# Patient Record
Sex: Male | Born: 1965 | Race: White | Hispanic: No | Marital: Single | Smoking: Former smoker
Health system: Southern US, Community
[De-identification: ages and names within clinical notes are randomized; demographics above are authoritative.]

## PROBLEM LIST (undated history)

## (undated) DIAGNOSIS — I509 Heart failure, unspecified: Secondary | ICD-10-CM

## (undated) DIAGNOSIS — F17200 Nicotine dependence, unspecified, uncomplicated: Secondary | ICD-10-CM

## (undated) DIAGNOSIS — I219 Acute myocardial infarction, unspecified: Secondary | ICD-10-CM

## (undated) DIAGNOSIS — E119 Type 2 diabetes mellitus without complications: Secondary | ICD-10-CM

---
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
## 1985-07-05 DIAGNOSIS — F17200 Nicotine dependence, unspecified, uncomplicated: Secondary | ICD-10-CM

## 1985-07-05 HISTORY — DX: Nicotine dependence, unspecified, uncomplicated: F17.200

## 2003-07-06 DIAGNOSIS — I509 Heart failure, unspecified: Secondary | ICD-10-CM

## 2003-07-06 HISTORY — DX: Heart failure, unspecified: I50.9

## 2013-07-05 DIAGNOSIS — I219 Acute myocardial infarction, unspecified: Secondary | ICD-10-CM

## 2013-07-05 HISTORY — DX: Acute myocardial infarction, unspecified: I21.9

## 2016-07-05 DIAGNOSIS — E119 Type 2 diabetes mellitus without complications: Secondary | ICD-10-CM

## 2016-07-05 HISTORY — DX: Type 2 diabetes mellitus without complications: E11.9

## 2019-02-02 DIAGNOSIS — I161 Hypertensive emergency: Secondary | ICD-10-CM

## 2019-02-02 DIAGNOSIS — R7989 Other specified abnormal findings of blood chemistry: Secondary | ICD-10-CM

## 2019-02-02 DIAGNOSIS — I351 Nonrheumatic aortic (valve) insufficiency: Secondary | ICD-10-CM

## 2019-02-02 DIAGNOSIS — R634 Abnormal weight loss: Secondary | ICD-10-CM

## 2019-02-02 DIAGNOSIS — I241 Dressler's syndrome: Secondary | ICD-10-CM

## 2019-02-02 DIAGNOSIS — I509 Heart failure, unspecified: Secondary | ICD-10-CM

## 2019-02-02 DIAGNOSIS — Z720 Tobacco use: Secondary | ICD-10-CM

## 2019-02-02 DIAGNOSIS — I340 Nonrheumatic mitral (valve) insufficiency: Secondary | ICD-10-CM

## 2019-02-02 DIAGNOSIS — Z9114 Patient's other noncompliance with medication regimen: Secondary | ICD-10-CM

## 2019-02-03 DIAGNOSIS — R079 Chest pain, unspecified: Secondary | ICD-10-CM

## 2019-02-03 DIAGNOSIS — J189 Pneumonia, unspecified organism: Secondary | ICD-10-CM

## 2019-02-03 DIAGNOSIS — I5043 Acute on chronic combined systolic (congestive) and diastolic (congestive) heart failure: Secondary | ICD-10-CM

## 2019-02-03 DIAGNOSIS — E785 Hyperlipidemia, unspecified: Secondary | ICD-10-CM

## 2019-02-03 DIAGNOSIS — I472 Ventricular tachycardia: Secondary | ICD-10-CM

## 2019-02-03 DIAGNOSIS — I10 Essential (primary) hypertension: Secondary | ICD-10-CM

## 2019-02-03 DIAGNOSIS — E119 Type 2 diabetes mellitus without complications: Secondary | ICD-10-CM

## 2019-02-04 ENCOUNTER — Encounter (HOSPITAL_COMMUNITY): Payer: Self-pay

## 2019-02-04 ENCOUNTER — Inpatient Hospital Stay (HOSPITAL_COMMUNITY)
Admission: AD | Admit: 2019-02-04 | Discharge: 2019-02-08 | DRG: 177 | Disposition: A | Discharge status: Home / Self Care | Payer: Medicaid Other | Source: Other Acute Inpatient Hospital | Attending: Internal Medicine | Admitting: Internal Medicine

## 2019-02-04 ENCOUNTER — Other Ambulatory Visit: Payer: Self-pay

## 2019-02-04 DIAGNOSIS — J189 Pneumonia, unspecified organism: Secondary | ICD-10-CM

## 2019-02-04 DIAGNOSIS — E1165 Type 2 diabetes mellitus with hyperglycemia: Secondary | ICD-10-CM | POA: Diagnosis present

## 2019-02-04 DIAGNOSIS — F1390 Sedative, hypnotic, or anxiolytic use, unspecified, uncomplicated: Secondary | ICD-10-CM | POA: Diagnosis present

## 2019-02-04 DIAGNOSIS — I2510 Atherosclerotic heart disease of native coronary artery without angina pectoris: Secondary | ICD-10-CM | POA: Diagnosis present

## 2019-02-04 DIAGNOSIS — R6881 Early satiety: Secondary | ICD-10-CM | POA: Diagnosis present

## 2019-02-04 DIAGNOSIS — Z681 Body mass index (BMI) 19 or less, adult: Secondary | ICD-10-CM | POA: Diagnosis not present

## 2019-02-04 DIAGNOSIS — I252 Old myocardial infarction: Secondary | ICD-10-CM | POA: Diagnosis not present

## 2019-02-04 DIAGNOSIS — E43 Unspecified severe protein-calorie malnutrition: Secondary | ICD-10-CM | POA: Diagnosis present

## 2019-02-04 DIAGNOSIS — R0602 Shortness of breath: Secondary | ICD-10-CM

## 2019-02-04 DIAGNOSIS — Z794 Long term (current) use of insulin: Secondary | ICD-10-CM | POA: Diagnosis not present

## 2019-02-04 DIAGNOSIS — Z9119 Patient's noncompliance with other medical treatment and regimen: Secondary | ICD-10-CM

## 2019-02-04 DIAGNOSIS — Z20828 Contact with and (suspected) exposure to other viral communicable diseases: Secondary | ICD-10-CM | POA: Diagnosis present

## 2019-02-04 DIAGNOSIS — Z9114 Patient's other noncompliance with medication regimen: Secondary | ICD-10-CM

## 2019-02-04 DIAGNOSIS — F17210 Nicotine dependence, cigarettes, uncomplicated: Secondary | ICD-10-CM | POA: Diagnosis present

## 2019-02-04 DIAGNOSIS — I255 Ischemic cardiomyopathy: Secondary | ICD-10-CM | POA: Diagnosis present

## 2019-02-04 DIAGNOSIS — Z79899 Other long term (current) drug therapy: Secondary | ICD-10-CM | POA: Diagnosis not present

## 2019-02-04 DIAGNOSIS — E785 Hyperlipidemia, unspecified: Secondary | ICD-10-CM | POA: Diagnosis present

## 2019-02-04 DIAGNOSIS — J869 Pyothorax without fistula: Principal | ICD-10-CM | POA: Diagnosis present

## 2019-02-04 DIAGNOSIS — R918 Other nonspecific abnormal finding of lung field: Secondary | ICD-10-CM | POA: Diagnosis present

## 2019-02-04 DIAGNOSIS — Z9689 Presence of other specified functional implants: Secondary | ICD-10-CM

## 2019-02-04 DIAGNOSIS — R9439 Abnormal result of other cardiovascular function study: Secondary | ICD-10-CM

## 2019-02-04 DIAGNOSIS — I5023 Acute on chronic systolic (congestive) heart failure: Secondary | ICD-10-CM | POA: Diagnosis present

## 2019-02-04 DIAGNOSIS — J918 Pleural effusion in other conditions classified elsewhere: Secondary | ICD-10-CM | POA: Diagnosis present

## 2019-02-04 DIAGNOSIS — I712 Thoracic aortic aneurysm, without rupture: Secondary | ICD-10-CM | POA: Diagnosis present

## 2019-02-04 DIAGNOSIS — I110 Hypertensive heart disease with heart failure: Secondary | ICD-10-CM | POA: Diagnosis present

## 2019-02-04 DIAGNOSIS — R64 Cachexia: Secondary | ICD-10-CM | POA: Diagnosis present

## 2019-02-04 DIAGNOSIS — J90 Pleural effusion, not elsewhere classified: Secondary | ICD-10-CM | POA: Diagnosis present

## 2019-02-04 LAB — CBC WITH DIFFERENTIAL/PLATELET
Abs Immature Granulocytes: 0.1 10*3/uL — ABNORMAL HIGH (ref 0.00–0.07)
Basophils Absolute: 0.1 10*3/uL (ref 0.0–0.1)
Basophils Relative: 0 %
Eosinophils Absolute: 0.1 10*3/uL (ref 0.0–0.5)
Eosinophils Relative: 1 %
HCT: 41.3 % (ref 39.0–52.0)
Hemoglobin: 13.8 g/dL (ref 13.0–17.0)
Immature Granulocytes: 1 %
Lymphocytes Relative: 6 %
Lymphs Abs: 1.2 10*3/uL (ref 0.7–4.0)
MCH: 31.4 pg (ref 26.0–34.0)
MCHC: 33.4 g/dL (ref 30.0–36.0)
MCV: 94.1 fL (ref 80.0–100.0)
Monocytes Absolute: 1.7 10*3/uL — ABNORMAL HIGH (ref 0.1–1.0)
Monocytes Relative: 9 %
Neutro Abs: 15.1 10*3/uL — ABNORMAL HIGH (ref 1.7–7.7)
Neutrophils Relative %: 83 %
Platelets: 394 10*3/uL (ref 150–400)
RBC: 4.39 MIL/uL (ref 4.22–5.81)
RDW: 13.6 % (ref 11.5–15.5)
WBC: 18.2 10*3/uL — ABNORMAL HIGH (ref 4.0–10.5)
nRBC: 0 % (ref 0.0–0.2)

## 2019-02-04 LAB — MAGNESIUM: Magnesium: 2.2 mg/dL (ref 1.7–2.4)

## 2019-02-04 LAB — COMPREHENSIVE METABOLIC PANEL
ALT: 22 U/L (ref 0–44)
AST: 19 U/L (ref 15–41)
Albumin: 2 g/dL — ABNORMAL LOW (ref 3.5–5.0)
Alkaline Phosphatase: 66 U/L (ref 38–126)
Anion gap: 14 (ref 5–15)
BUN: 31 mg/dL — ABNORMAL HIGH (ref 6–20)
CO2: 25 mmol/L (ref 22–32)
Calcium: 8.3 mg/dL — ABNORMAL LOW (ref 8.9–10.3)
Chloride: 92 mmol/L — ABNORMAL LOW (ref 98–111)
Creatinine, Ser: 0.9 mg/dL (ref 0.61–1.24)
GFR calc Af Amer: >60 mL/min (ref >60)
GFR calc non Af Amer: >60 mL/min (ref >60)
Glucose, Bld: 227 mg/dL — ABNORMAL HIGH (ref 70–99)
Potassium: 3.8 mmol/L (ref 3.5–5.1)
Sodium: 131 mmol/L — ABNORMAL LOW (ref 135–145)
Total Bilirubin: 0.3 mg/dL (ref 0.3–1.2)
Total Protein: 5.8 g/dL — ABNORMAL LOW (ref 6.5–8.1)

## 2019-02-04 LAB — GLUCOSE, CAPILLARY
Glucose-Capillary: 276 mg/dL — ABNORMAL HIGH (ref 70–99)
Glucose-Capillary: 270 mg/dL — ABNORMAL HIGH (ref 70–99)

## 2019-02-04 LAB — TSH: TSH: 1.91 u[IU]/mL (ref 0.350–4.500)

## 2019-02-04 LAB — BRAIN NATRIURETIC PEPTIDE: B Natriuretic Peptide: 1550.4 pg/mL — ABNORMAL HIGH (ref 0.0–100.0)

## 2019-02-04 LAB — HEMOGLOBIN A1C
Hgb A1c MFr Bld: 8.7 % — ABNORMAL HIGH (ref 4.8–5.6)
Mean Plasma Glucose: 202.99 mg/dL

## 2019-02-04 LAB — PROTIME-INR
INR: 1.2 (ref 0.8–1.2)
Prothrombin Time: 14.7 seconds (ref 11.4–15.2)

## 2019-02-04 LAB — MRSA PCR SCREENING: MRSA by PCR: NEGATIVE

## 2019-02-04 LAB — PHOSPHORUS: Phosphorus: 3.7 mg/dL (ref 2.5–4.6)

## 2019-02-04 MED ORDER — GLIPIZIDE 5 MG PO TABS
5.0000 mg | ORAL_TABLET | Freq: Two times a day (BID) | ORAL | Status: DC
  Filled 2019-02-04: qty 1

## 2019-02-04 MED ORDER — SODIUM CHLORIDE 0.9 % IV SOLN
2.0000 g | Freq: Once | INTRAVENOUS | Status: AC
  Administered 2019-02-04: 22:00:00 2 g via INTRAVENOUS
  Filled 2019-02-04: qty 2

## 2019-02-04 MED ORDER — HEPARIN SODIUM (PORCINE) 5000 UNIT/ML IJ SOLN
5000.0000 [IU] | Freq: Three times a day (TID) | INTRAMUSCULAR | Status: DC
  Administered 2019-02-04 – 2019-02-08 (×11): 5000 [IU] via SUBCUTANEOUS
  Filled 2019-02-04 (×11): qty 1

## 2019-02-04 MED ORDER — INSULIN ASPART 100 UNIT/ML ~~LOC~~ SOLN
0.0000 [IU] | Freq: Three times a day (TID) | SUBCUTANEOUS | Status: DC
  Administered 2019-02-05: 3 [IU] via SUBCUTANEOUS
  Administered 2019-02-05: 16:00:00 9 [IU] via SUBCUTANEOUS
  Administered 2019-02-05 – 2019-02-06 (×2): 2 [IU] via SUBCUTANEOUS
  Administered 2019-02-06: 17:00:00 7 [IU] via SUBCUTANEOUS
  Administered 2019-02-06: 5 [IU] via SUBCUTANEOUS
  Administered 2019-02-07 (×2): 7 [IU] via SUBCUTANEOUS
  Administered 2019-02-07: 3 [IU] via SUBCUTANEOUS
  Administered 2019-02-08: 1 [IU] via SUBCUTANEOUS

## 2019-02-04 MED ORDER — ASPIRIN EC 81 MG PO TBEC
81.0000 mg | DELAYED_RELEASE_TABLET | Freq: Every day | ORAL | Status: DC
  Administered 2019-02-04 – 2019-02-05 (×2): 81 mg via ORAL
  Filled 2019-02-04 (×2): qty 1

## 2019-02-04 MED ORDER — METOPROLOL SUCCINATE ER 100 MG PO TB24
100.0000 mg | ORAL_TABLET | Freq: Every day | ORAL | Status: DC
  Administered 2019-02-04 – 2019-02-08 (×4): 100 mg via ORAL
  Filled 2019-02-04 (×4): qty 1

## 2019-02-04 MED ORDER — BUDESONIDE 0.25 MG/2ML IN SUSP
0.2500 mg | Freq: Two times a day (BID) | RESPIRATORY_TRACT | Status: DC
  Administered 2019-02-05 – 2019-02-08 (×7): 0.25 mg via RESPIRATORY_TRACT
  Filled 2019-02-04 (×7): qty 2

## 2019-02-04 MED ORDER — IPRATROPIUM-ALBUTEROL 0.5-2.5 (3) MG/3ML IN SOLN: 3.0000 mL | Freq: Four times a day (QID) | RESPIRATORY_TRACT | Status: DC | PRN

## 2019-02-04 MED ORDER — SODIUM CHLORIDE 0.9 % IV SOLN
2.0000 g | Freq: Three times a day (TID) | INTRAVENOUS | Status: DC
  Administered 2019-02-05: 2 g via INTRAVENOUS
  Filled 2019-02-04 (×4): qty 2

## 2019-02-04 MED ORDER — IPRATROPIUM-ALBUTEROL 0.5-2.5 (3) MG/3ML IN SOLN
3.0000 mL | Freq: Four times a day (QID) | RESPIRATORY_TRACT | Status: DC
  Administered 2019-02-05 – 2019-02-06 (×8): 3 mL via RESPIRATORY_TRACT
  Filled 2019-02-04 (×9): qty 3

## 2019-02-04 MED ORDER — ATORVASTATIN CALCIUM 40 MG PO TABS
40.0000 mg | ORAL_TABLET | Freq: Every day | ORAL | Status: DC
  Administered 2019-02-04 – 2019-02-07 (×4): 40 mg via ORAL
  Filled 2019-02-04 (×4): qty 1

## 2019-02-04 MED ORDER — ISOSORBIDE MONONITRATE ER 30 MG PO TB24
30.0000 mg | ORAL_TABLET | Freq: Every day | ORAL | Status: DC
  Administered 2019-02-04 – 2019-02-08 (×4): 30 mg via ORAL
  Filled 2019-02-04 (×4): qty 1

## 2019-02-04 MED ORDER — ENSURE ENLIVE PO LIQD
237.0000 mL | Freq: Two times a day (BID) | ORAL | Status: DC
  Administered 2019-02-05 – 2019-02-08 (×5): 237 mL via ORAL

## 2019-02-04 MED ORDER — LISINOPRIL 20 MG PO TABS
20.0000 mg | ORAL_TABLET | Freq: Every day | ORAL | Status: DC
  Administered 2019-02-04 – 2019-02-08 (×4): 20 mg via ORAL
  Filled 2019-02-04 (×4): qty 1

## 2019-02-04 MED ORDER — CLONAZEPAM 0.5 MG PO TABS
0.5000 mg | ORAL_TABLET | Freq: Two times a day (BID) | ORAL | Status: DC | PRN
  Administered 2019-02-04 – 2019-02-08 (×6): 0.5 mg via ORAL
  Filled 2019-02-04 (×6): qty 1

## 2019-02-04 MED ORDER — ENOXAPARIN SODIUM 40 MG/0.4ML ~~LOC~~ SOLN: 40.0000 mg | SUBCUTANEOUS | Status: DC

## 2019-02-04 MED ORDER — INSULIN ASPART 100 UNIT/ML ~~LOC~~ SOLN
0.0000 [IU] | Freq: Every day | SUBCUTANEOUS | Status: DC
  Administered 2019-02-04: 3 [IU] via SUBCUTANEOUS
  Administered 2019-02-05: 22:00:00 5 [IU] via SUBCUTANEOUS
  Administered 2019-02-07: 3 [IU] via SUBCUTANEOUS

## 2019-02-04 NOTE — Plan of Care (Signed)
  Problem: Education: °Goal: Ability to demonstrate management of disease process will improve °Outcome: Progressing °  °Problem: Cardiac: °Goal: Ability to achieve and maintain adequate cardiopulmonary perfusion will improve °Outcome: Progressing °  °Problem: Clinical Measurements: °Goal: Respiratory complications will improve °Outcome: Progressing °  °

## 2019-02-04 NOTE — Progress Notes (Signed)
Patient admit to 2C14 from [facility]. Patient alert oriented x 4 no c/o pain at this time. Patient is on 4L O2 [place] and sat is 99% at rest. VS within normal range. Patient is in bed resting comfortably, bedside table, call light and telephone within reach. Will continue to monitor patient. Report received from Lucianne Muss, RN

## 2019-02-04 NOTE — Progress Notes (Signed)
Pharmacy Antibiotic Note  Mark Pittman is a 53 y.o. male admitted on 02/04/2019 with pneumonia.  Pharmacy has been consulted for cefepime dosing. -WBC= 18.2, afeb, CrCl ~ 70    Plan: -Cefepime 2gm IV q8h -Will follow renal function, cultures and clinical progress   Height: 5\' 6"  (167.6 cm) Weight: 120 lb 13 oz (54.8 kg) IBW/kg (Calculated) : 63.8  Temp (24hrs), Avg:98.1 F (36.7 C), Min:97.8 F (36.6 C), Max:98.3 F (36.8 C)  No results for input(s): WBC, CREATININE, LATICACIDVEN, VANCOTROUGH, VANCOPEAK, VANCORANDOM, GENTTROUGH, GENTPEAK, GENTRANDOM, TOBRATROUGH, TOBRAPEAK, TOBRARND, AMIKACINPEAK, AMIKACINTROU, AMIKACIN in the last 168 hours.  CrCl cannot be calculated (No successful lab value found.).    Not on File  Antimicrobials this admission: 8/2 cefepime>>  Dose adjustments this admission:   Microbiology results:   Thank you for allowing pharmacy to be a part of this patient's care.  Hildred Laser, PharmD Clinical Pharmacist **Pharmacist phone directory can now be found on [email or web address] ([email or web address]).  Listed under [place].

## 2019-02-04 NOTE — Consult Note (Addendum)
Cardiology Consultation:   Patient ID: [name]; [id number]; 08/14/1965   Admit date: 02/04/2019 Date of Consult: 02/04/2019  Primary Care Provider: No primary care provider on file. Primary Cardiologist: No primary care provider on file. Primary Electrophysiologist:  None  Chief Complaint: chest pain, dyspnea  Patient Profile:   Mark Pittman is a 53 y.o. male who presents with dyspnea and chest discomfort.  History of Present Illness:   Patient has had intermittent dyspnea and chest discomfort for the past month.  He has also noted lower extremity edema.  On Friday his symptoms worsened and he presented to the emergency room at [facility].  He was admitted there with possible pneumonia and heart failure. He was found to have a trop of 0.13, but was not treated with anticoagulation.  He was given antibiotics for pneumonia and lasix for HF.  Due to his chest pain he underwent a Lexiscan stress test which demonstrated an LV EF of 26%, with a fixed defect in the inferior lateral wall without reversible ischemia.  This was deemed high risk due to the low LV EF.  Echocardiogram demonstrated an EF of 35-40%.   He was transferred to medicine at [facility] health today for further treatment.  Currently he denies chest pain and states that he feels better compared to Friday.  He does have dyspnea when lying flat but states that he thinks he could lie flat for an hour for a procedure.  Past Medical History:  Diagnosis Date  . CHF (congestive heart failure), NYHA class I ([place]) 2005   patient  . Diabetes ([place]) 2018   Patient  . MI (myocardial infarction) ([place]) 2015   Patient  . Smoker 1987   Source    Family History Noncontributory  Social History Tobacco use  Inpatient Medications: Scheduled Meds: . aspirin EC  81 mg Oral Daily  . atorvastatin  40 mg Oral q1800  . enoxaparin (LOVENOX) injection  40 mg Subcutaneous Q24H  . [START ON 02/05/2019] feeding supplement (ENSURE ENLIVE)   237 mL Oral BID BM  . [START ON 02/05/2019] glipiZIDE  5 mg Oral BID AC  . isosorbide mononitrate  30 mg Oral Daily  . lisinopril  20 mg Oral Daily  . metoprolol succinate  100 mg Oral Daily   Continuous Infusions: . ceFEPime (MAXIPIME) IV     PRN Meds: clonazePAM  Home Meds: Prior to Admission medications   Not on File    Allergies:   Not on File  Social History:   Social History   Socioeconomic History  . Marital status: Not on file    Spouse name: Not on file  . Number of children: Not on file  . Years of education: Not on file  . Highest education level: Not on file  Occupational History  . Not on file  Social Needs  . Financial resource strain: Not on file  . Food insecurity    Worry: Not on file    Inability: Not on file  . Transportation needs    Medical: Not on file    Non-medical: Not on file  Tobacco Use  . Smoking status: Former Smoker    Packs/day: 0.50    Years: 30.00    Pack years: 15.00    Quit date: 02/02/2019  . Smokeless tobacco: Never Used  . Tobacco comment: Patient states he quit, but he will take nicotine patch  Substance and Sexual Activity  . Alcohol use: Not Currently  . Drug use: Yes  Frequency: 2.0 times per week    Types: Marijuana  . Sexual activity: Not Currently    Partners: Male  Lifestyle  . Physical activity    Days per week: Not on file    Minutes per session: Not on file  . Stress: Not on file  Relationships  . Social Musician on phone: Not on file    Gets together: Not on file    Attends religious service: Not on file    Active member of club or organization: Not on file    Attends meetings of clubs or organizations: Not on file    Relationship status: Not on file  . Intimate partner violence    Fear of current or ex partner: Not on file    Emotionally abused: Not on file    Physically abused: Not on file    Forced sexual activity: Not on file  Other Topics Concern  . Not on file  Social History  Narrative  . Not on file     Family History:   Noncontributory    ROS:  Please see the history of present illness.  All other ROS reviewed and negative.     Physical Exam/Data:   Vitals:   02/04/19 1809 02/04/19 2013  BP: 118/87 (!) 128/91  Pulse: 76 79  Resp: (!) 32 (!) 25  Temp: 98.3 F (36.8 C) 97.8 F (36.6 C)  TempSrc: Oral Oral  SpO2: 99% 99%  Weight:  54.8 kg  Height:  5\' 6"  (1.676 m)    Intake/Output Summary (Last 24 hours) at 02/04/2019 2105 Last data filed at 02/04/2019 1930 Gross per 24 hour  Intake -  Output 100 ml  Net -100 ml   Last 3 Weights 02/04/2019  Weight (lbs) 120 lb 13 oz  Weight (kg) 54.8 kg     Body mass index is 19.5 kg/m.  General: Well developed, well nourished, in no acute distress. Head: Normocephalic, atraumatic, sclera non-icteric, no xanthomas, nares are without discharge.  Neck: Negative for carotid bruits. JVD not elevated. Lungs: Clear bilaterally to auscultation without wheezes, rales, or rhonchi. Breathing is unlabored. Heart: RRR with S1 S2. No murmurs, rubs, or gallops appreciated. Abdomen: Soft, non-tender, non-distended with normoactive bowel sounds. No hepatomegaly. No rebound/guarding. No obvious abdominal masses. Msk:  Strength and tone appear normal for age. Extremities: No clubbing or cyanosis. No edema.  Distal pedal pulses are 2+ and equal bilaterally. Neuro: Alert and oriented X 3. No facial asymmetry. No focal deficit. Moves all extremities spontaneously. Psych:  Responds to questions appropriately with a normal affect.   EKG:  Pending  Relevant CV Studies: Outside Nuclear stress: reportedly high risk  Laboratory Data:  High Sensitivity Troponin:  No results for input(s): TROPONINIHS in the last 720 hours.   Cardiac EnzymesNo results for input(s): TROPONINI in the last 168 hours. No results for input(s): TROPIPOC in the last 168 hours.  ChemistryNo results for input(s): NA, K, CL, CO2, GLUCOSE, BUN, CREATININE,  CALCIUM, GFRNONAA, GFRAA, ANIONGAP in the last 168 hours.  No results for input(s): PROT, ALBUMIN, AST, ALT, ALKPHOS, BILITOT in the last 168 hours. HematologyNo results for input(s): WBC, RBC, HGB, HCT, MCV, MCH, MCHC, RDW, PLT in the last 168 hours. BNPNo results for input(s): BNP, PROBNP in the last 168 hours.  DDimer No results for input(s): DDIMER in the last 168 hours.   Radiology/Studies:  No results found.  Assessment and Plan:   1. Possible NSTEMI 2. Positive stress test  Currently he is stable and has no chest discomfort.  Probably no need to start IV heparin unless he develops chest pain while here, given that he initially presented several days ago.  Would keep n.p.o. at midnight and tentatively plan for left heart catheterization Monday or Tuesday.  --N.p.o. at midnight --Likely left heart cath Mon or Tues --Echocardiogram       For questions or updates, please contact [facility] Please consult [email or web address] for contact info under     Signed, Allison QuarryBARNETT, ADAM S, MD  02/04/2019 9:05 PM

## 2019-02-04 NOTE — H&P (Addendum)
History and Physical  Mark Pittman PQD:[id number] DOB: July 20, 1965 DOA: 02/04/2019  Referring physician: Transferred from [facility] by Junious Silk, nurse practitioner. PCP: No primary care provider on file.  Outpatient Specialists:    Patient coming from: [facility]  Chief Complaint: Pneumonia with parapneumonic effusion  HPI: Patient is a 53 year old male with past medical history significant for coronary artery disease, hypertension, congestive heart failure with EF of 35 to 40% as per echocardiogram and 26% as per Lexiscan stress test done recently, diabetes mellitus, myocardial infarction with last cardiac catheterization done in 2015 that led to balloon angioplasty, tobacco use and noncompliance.  Patient presented to [facility] a few days ago with shortness of breath and left-sided chest pain.  Patient was admitted to [facility] with possible community-acquired pneumonia and non-STEMI/unstable angina.  Patient was initially on IV Rocephin and ceftriaxone, but this was changed to IV vancomycin and cefepime due to worsening pneumonic symptoms and signs, including worsening fever and leukocytosis (WBC rose from 16.7 to 22.1).  Imagings studies done revealed complex parapneumonic effusion, hence, the decision to transfer patient to [facility] for pulmonary input.  CT scan also revealed 1.4 cm left lower lobe nodule as well as other tiny nodules worrisome for possible metastatic disease.  4 cm ascending aortic aneurysm was also reported.  Patient has lost about 20 pounds in weight over the last 2 months.  Patient smokes cigarettes, and has smoked for over 30 years.  Patient also endorses early satiety.  No prior GI work-up.  Patient's cardiac problems were also worked up.  Patient was treated for CHF exacerbation.  Apparently, cardiac BNP was 34,200.  Patient underwent cardiac stress test that was said to have revealed lateral T wave changes that resolved with rest  and nitrates, and EF of 26%.  The transferring provider from [facility] has already discussed with Dr. Johney Frame with the cardiology team.  Cardiac catheterization is on hold due to infective process that is ongoing.  Echocardiogram done at [facility] is said to reveal EF of 35 to 40%, with diffuse hypokinesis, mild to moderate aortic regurgitation and mild mitral regurgitation.  Patient is a poor historian.  Patient only tells me that he went over to [facility] about 2 days ago with chest pain and leg edema, and will not elaborate further.  No headache, no neck pain, no URI symptoms, shortness of breath has resolved, no chest pain reported at the moment, no nausea vomiting, no diarrhea or change in bowel habit per patient as well as satiety, no urinary symptoms.   Review of Systems:  Negative for fever, visual changes, sore throat, rash, new muscle aches, chest pain, SOB, dysuria, bleeding, n/v/abdominal pain.  Past Medical History:  Diagnosis Date   CHF (congestive heart failure), NYHA class I (HCC) 2005   patient   Diabetes (H[facility]) 2018   Patient   MI (myocardial infarction) [facility]) 2015   Patient   Smoker 80   Source     reports that he quit smoking 2 days ago. He has a 15.00 pack-year smoking history. He has never used smokeless tobacco. He reports previous alcohol use. He reports current drug use. Frequency: 2.00 times per week. Drug: Marijuana.  Not on File  No family history on file.   Prior to Admission medications   Not on File    Physical Exam: Vitals:   02/04/19 1809 02/04/19 2013  BP: 118/87   Pulse: 76   Resp: (!) 32   Temp: 98.3  F (36.8 C)   TempSrc: Oral   SpO2: 99%   Weight:  54.8 kg  Height:  5\' 6"  (1.676 m)   Constitutional:   Patient is cachectic.  Appears calm and comfortable Eyes:   Mild pallor.  No jaundice.  ENMT:   external ears, nose appear normal Neck:   Neck is supple. No JVD Respiratory:  Decreased air entry globally with  significantly decreased air entry right lung field. Cardiovascular:   S1S2  No LE extremity edema   Abdomen:   Abdomen is soft and non tender. Organs are difficult to assess. Neurologic:   Awake and alert.  Moves all limbs.  Wt Readings from Last 3 Encounters:  02/04/19 54.8 kg    I have personally reviewed following labs and imaging studies  Labs on Admission:  CBC: No results for input(s): WBC, NEUTROABS, HGB, HCT, MCV, PLT in the last 168 hours. Basic Metabolic Panel: No results for input(s): NA, K, CL, CO2, GLUCOSE, BUN, CREATININE, CALCIUM, MG, PHOS in the last 168 hours. Liver Function Tests: No results for input(s): AST, ALT, ALKPHOS, BILITOT, PROT, ALBUMIN in the last 168 hours. No results for input(s): LIPASE, AMYLASE in the last 168 hours. No results for input(s): AMMONIA in the last 168 hours. Coagulation Profile: No results for input(s): INR, PROTIME in the last 168 hours. Cardiac Enzymes: No results for input(s): CKTOTAL, CKMB, CKMBINDEX, TROPONINI in the last 168 hours. BNP (last 3 results) No results for input(s): PROBNP in the last 8760 hours. HbA1C: No results for input(s): HGBA1C in the last 72 hours. CBG: Recent Labs  Lab 02/04/19 1830  GLUCAP 276*   Lipid Profile: No results for input(s): CHOL, HDL, LDLCALC, TRIG, CHOLHDL, LDLDIRECT in the last 72 hours. Thyroid Function Tests: No results for input(s): TSH, T4TOTAL, FREET4, T3FREE, THYROIDAB in the last 72 hours. Anemia Panel: No results for input(s): VITAMINB12, FOLATE, FERRITIN, TIBC, IRON, RETICCTPCT in the last 72 hours. Urine analysis: No results found for: COLORURINE, APPEARANCEUR, LABSPEC, PHURINE, GLUCOSEU, HGBUR, BILIRUBINUR, KETONESUR, PROTEINUR, UROBILINOGEN, NITRITE, LEUKOCYTESUR Sepsis Labs: @LABRCNTIP (procalcitonin:4,lacticidven:4) ) Recent Results (from the past 240 hour(s))  MRSA PCR Screening     Status: None   Collection Time: 02/04/19  6:09 PM   Specimen: Nasopharyngeal    Result Value Ref Range Status   MRSA by PCR NEGATIVE NEGATIVE Final    Comment:        The GeneXpert MRSA Assay (FDA approved for NASAL specimens only), is one component of a comprehensive MRSA colonization surveillance program. It is not intended to diagnose MRSA infection nor to guide or monitor treatment for MRSA infections. Performed at [facility], [address] [place], [place] [address]       Radiological Exams on Admission: No results found.  Active Problems:   Parapneumonic effusion   Assessment/Plan Right-sided parapneumonic effusion: Admit patient as planned. Continue IV antibiotics (IV cefepime) Consult pulmonary team (Case discussed with Jeannette CorpusEmily Aventura) Patient will need thoracentesis and pleural fluid analysis, including cytology Further management will depend on hospital course.  Lung nodules, worrisome for metastasis: Patient has lost weight. Patient has a low satiety. Patient has never had GI work-up We will send stool for occult blood Threshold to consult GI team Patient may need EGD all CT scan of the abdomen and pelvis.   Chest pain/NSTEMI/unstable angina: Patient has undergone cardiac stress test at [facility] (see above) Continue aspirin, ACE inhibitor, beta-blocker and statin. Cardiology team is already consulted.  I have also spoken to  the covering cardiology fellow. For likely cardiac catheterization when infective processes settle.  Acute on chronic versus chronic systolic congestive heart failure: Patient has been on IV Lasix at [facility]. Stable for now Continue above medications Manage volume.   Tobacco use: Conseled.  Diabetes mellitus: Continue glipizide. Sliding scale insulin coverage. Further management will depend on hospital course.  Hypertension: Blood pressure seems to be on the low side for now. Monitor closely Cautious use of medications.  Hyperlipidemia: Continue statins.  4 cm  ascending aortic aneurysm: Monitor as per guidelines.   DVT prophylaxis: Subcutaneous Lovenox Code Status: Full code Family Communication:  Disposition Plan: Home eventually Consults called: Pulmonary and cardiology Admission status: Inpatient  Time spent: 65 minutes  Dana Allan, MD  [facility] Pager #: [phone] 7PM-7AM contact night coverage as above  02/04/2019, 8:34 PM

## 2019-02-05 ENCOUNTER — Inpatient Hospital Stay (HOSPITAL_COMMUNITY): Payer: Medicaid Other

## 2019-02-05 ENCOUNTER — Other Ambulatory Visit (HOSPITAL_COMMUNITY): Payer: Self-pay

## 2019-02-05 DIAGNOSIS — I5021 Acute systolic (congestive) heart failure: Secondary | ICD-10-CM

## 2019-02-05 DIAGNOSIS — I351 Nonrheumatic aortic (valve) insufficiency: Secondary | ICD-10-CM

## 2019-02-05 DIAGNOSIS — F17200 Nicotine dependence, unspecified, uncomplicated: Secondary | ICD-10-CM

## 2019-02-05 DIAGNOSIS — E118 Type 2 diabetes mellitus with unspecified complications: Secondary | ICD-10-CM

## 2019-02-05 DIAGNOSIS — E43 Unspecified severe protein-calorie malnutrition: Secondary | ICD-10-CM | POA: Insufficient documentation

## 2019-02-05 DIAGNOSIS — R634 Abnormal weight loss: Secondary | ICD-10-CM

## 2019-02-05 DIAGNOSIS — R9439 Abnormal result of other cardiovascular function study: Secondary | ICD-10-CM

## 2019-02-05 DIAGNOSIS — R7989 Other specified abnormal findings of blood chemistry: Secondary | ICD-10-CM

## 2019-02-05 DIAGNOSIS — E1165 Type 2 diabetes mellitus with hyperglycemia: Secondary | ICD-10-CM

## 2019-02-05 DIAGNOSIS — J869 Pyothorax without fistula: Principal | ICD-10-CM

## 2019-02-05 LAB — BODY FLUID CELL COUNT WITH DIFFERENTIAL
Eos, Fluid: 0 %
Lymphs, Fluid: 0 %
Monocyte-Macrophage-Serous Fluid: 1 % — ABNORMAL LOW (ref 50–90)
Neutrophil Count, Fluid: 99 % — ABNORMAL HIGH (ref 0–25)
Total Nucleated Cell Count, Fluid: 49000 cu mm — ABNORMAL HIGH (ref 0–1000)

## 2019-02-05 LAB — GLUCOSE, CAPILLARY
Glucose-Capillary: 201 mg/dL — ABNORMAL HIGH (ref 70–99)
Glucose-Capillary: 172 mg/dL — ABNORMAL HIGH (ref 70–99)
Glucose-Capillary: 372 mg/dL — ABNORMAL HIGH (ref 70–99)
Glucose-Capillary: 388 mg/dL — ABNORMAL HIGH (ref 70–99)

## 2019-02-05 LAB — LACTATE DEHYDROGENASE, PLEURAL OR PERITONEAL FLUID: LD, Fluid: 993 U/L — ABNORMAL HIGH (ref 3–23)

## 2019-02-05 LAB — PROTEIN, PLEURAL OR PERITONEAL FLUID: Total protein, fluid: 3.1 g/dL

## 2019-02-05 LAB — GRAM STAIN

## 2019-02-05 LAB — CBC
HCT: 41 % (ref 39.0–52.0)
Hemoglobin: 13.6 g/dL (ref 13.0–17.0)
MCH: 31.3 pg (ref 26.0–34.0)
MCHC: 33.2 g/dL (ref 30.0–36.0)
MCV: 94.3 fL (ref 80.0–100.0)
Platelets: 390 10*3/uL (ref 150–400)
RBC: 4.35 MIL/uL (ref 4.22–5.81)
RDW: 13.6 % (ref 11.5–15.5)
WBC: 17.1 10*3/uL — ABNORMAL HIGH (ref 4.0–10.5)
nRBC: 0 % (ref 0.0–0.2)

## 2019-02-05 LAB — ECHOCARDIOGRAM COMPLETE
Height: 66 in
Weight: 1932.99 oz

## 2019-02-05 LAB — BASIC METABOLIC PANEL
Anion gap: 9 (ref 5–15)
BUN: 32 mg/dL — ABNORMAL HIGH (ref 6–20)
CO2: 29 mmol/L (ref 22–32)
Calcium: 8.5 mg/dL — ABNORMAL LOW (ref 8.9–10.3)
Chloride: 97 mmol/L — ABNORMAL LOW (ref 98–111)
Creatinine, Ser: 1.03 mg/dL (ref 0.61–1.24)
GFR calc Af Amer: >60 mL/min (ref >60)
GFR calc non Af Amer: >60 mL/min (ref >60)
Glucose, Bld: 223 mg/dL — ABNORMAL HIGH (ref 70–99)
Potassium: 3.8 mmol/L (ref 3.5–5.1)
Sodium: 135 mmol/L (ref 135–145)

## 2019-02-05 LAB — GLUCOSE, PLEURAL OR PERITONEAL FLUID: Glucose, Fluid: <20 mg/dL

## 2019-02-05 LAB — HIV ANTIBODY (ROUTINE TESTING W REFLEX): HIV Screen 4th Generation wRfx: NONREACTIVE

## 2019-02-05 LAB — SARS CORONAVIRUS 2 BY RT PCR (HOSPITAL ORDER, PERFORMED IN ~~LOC~~ HOSPITAL LAB): SARS Coronavirus 2: NEGATIVE

## 2019-02-05 MED ORDER — LIDOCAINE HCL 1 % IJ SOLN
INTRAMUSCULAR | Status: AC
  Filled 2019-02-05: qty 20

## 2019-02-05 MED ORDER — FENTANYL CITRATE (PF) 100 MCG/2ML IJ SOLN
50.0000 ug | Freq: Once | INTRAMUSCULAR | Status: AC
  Administered 2019-02-05: 50 ug via INTRAVENOUS
  Filled 2019-02-05: qty 2

## 2019-02-05 MED ORDER — KETOROLAC TROMETHAMINE 30 MG/ML IJ SOLN
30.0000 mg | Freq: Four times a day (QID) | INTRAMUSCULAR | Status: AC
  Administered 2019-02-05 – 2019-02-07 (×8): 30 mg via INTRAVENOUS
  Filled 2019-02-05 (×8): qty 1

## 2019-02-05 MED ORDER — SODIUM CHLORIDE 0.9% FLUSH
3.0000 mL | Freq: Two times a day (BID) | INTRAVENOUS | Status: DC
  Administered 2019-02-05 – 2019-02-07 (×5): 3 mL via INTRAVENOUS

## 2019-02-05 MED ORDER — MORPHINE SULFATE (PF) 2 MG/ML IV SOLN
2.0000 mg | Freq: Once | INTRAVENOUS | Status: AC
  Administered 2019-02-05: 2 mg via INTRAVENOUS
  Filled 2019-02-05: qty 1

## 2019-02-05 MED ORDER — ASPIRIN 81 MG PO CHEW
81.0000 mg | CHEWABLE_TABLET | ORAL | Status: AC
  Administered 2019-02-06: 06:00:00 81 mg via ORAL
  Filled 2019-02-05: qty 1

## 2019-02-05 MED ORDER — SODIUM CHLORIDE 0.9 % IV SOLN
3.0000 g | Freq: Four times a day (QID) | INTRAVENOUS | Status: DC
  Administered 2019-02-05 – 2019-02-07 (×8): 3 g via INTRAVENOUS
  Filled 2019-02-05: qty 8
  Filled 2019-02-05 (×3): qty 3
  Filled 2019-02-05 (×2): qty 8
  Filled 2019-02-05 (×2): qty 3
  Filled 2019-02-05 (×2): qty 8

## 2019-02-05 MED ORDER — FAMOTIDINE 20 MG PO TABS
20.0000 mg | ORAL_TABLET | Freq: Every day | ORAL | Status: DC
  Administered 2019-02-05 – 2019-02-07 (×3): 20 mg via ORAL
  Filled 2019-02-05 (×3): qty 1

## 2019-02-05 MED ORDER — ADULT MULTIVITAMIN W/MINERALS CH
1.0000 | ORAL_TABLET | Freq: Every day | ORAL | Status: DC
  Administered 2019-02-05 – 2019-02-08 (×3): 1 via ORAL
  Filled 2019-02-05 (×3): qty 1

## 2019-02-05 MED ORDER — SODIUM CHLORIDE (PF) 0.9 % IJ SOLN
10.0000 mg | Freq: Once | INTRAMUSCULAR | Status: AC
  Administered 2019-02-05: 10 mg via INTRAPLEURAL
  Filled 2019-02-05: qty 10

## 2019-02-05 MED ORDER — SODIUM CHLORIDE 0.9% FLUSH: 3.0000 mL | INTRAVENOUS | Status: DC | PRN

## 2019-02-05 MED ORDER — OXYCODONE-ACETAMINOPHEN 5-325 MG PO TABS
1.0000 | ORAL_TABLET | ORAL | Status: DC | PRN
  Administered 2019-02-05 – 2019-02-08 (×7): 1 via ORAL
  Filled 2019-02-05 (×7): qty 1

## 2019-02-05 MED ORDER — SODIUM CHLORIDE 0.9 % IV SOLN: 250.0000 mL | INTRAVENOUS | Status: DC | PRN

## 2019-02-05 MED ORDER — SODIUM CHLORIDE 0.9 % IV SOLN
INTRAVENOUS | Status: DC
  Administered 2019-02-06: 06:00:00 via INTRAVENOUS

## 2019-02-05 MED ORDER — STERILE WATER FOR INJECTION IJ SOLN
5.0000 mg | Freq: Once | RESPIRATORY_TRACT | Status: AC
  Administered 2019-02-05: 15:00:00 5 mg via INTRAPLEURAL
  Filled 2019-02-05: qty 5

## 2019-02-05 MED ORDER — FENTANYL CITRATE (PF) 100 MCG/2ML IJ SOLN
50.0000 ug | INTRAMUSCULAR | Status: DC | PRN
  Administered 2019-02-05 (×2): 50 ug via INTRAVENOUS
  Filled 2019-02-05 (×2): qty 2

## 2019-02-05 NOTE — Progress Notes (Signed)
Patient c/o a constant 6/10 left lower thorax pain. 2 mg IV Morphine given per order with some relief.

## 2019-02-05 NOTE — Significant Event (Signed)
Rapid Response Event Note  Bedside Procedure - Chest Tube Placement.   Called by PCCM NP/MD to assist with chest tube placement. Informed Consent, Time Out, and supplies were obtained. Patient was hemodynamically stable prior to, during, and post procedure (see VS Flowsheets).   Call Time 1108 Arrival Time 1109 End Time 1145  Meghanne Pletz R

## 2019-02-05 NOTE — H&P (View-Only) (Signed)
Progress Note  Patient Name: Mark Pittman Date of Encounter: 02/05/2019  Primary Cardiologist: New (previously followed by Dr. Marlyn Corporal)  Subjective   Breathing improved. No recurrent chest pain  Inpatient Medications    Scheduled Meds:  aspirin EC  81 mg Oral Daily   atorvastatin  40 mg Oral q1800   budesonide (PULMICORT) nebulizer solution  0.25 mg Nebulization BID   feeding supplement (ENSURE ENLIVE)  237 mL Oral BID BM   heparin  5,000 Units Subcutaneous Q8H   insulin aspart  0-5 Units Subcutaneous QHS   insulin aspart  0-9 Units Subcutaneous TID WC   ipratropium-albuterol  3 mL Nebulization Q6H   isosorbide mononitrate  30 mg Oral Daily   lisinopril  20 mg Oral Daily   metoprolol succinate  100 mg Oral Daily   Continuous Infusions:  ceFEPime (MAXIPIME) IV 2 g (02/05/19 0658)   PRN Meds: clonazePAM, ipratropium-albuterol   Vital Signs    Vitals:   02/05/19 0737 02/05/19 0751 02/05/19 0911 02/05/19 1042  BP:  106/75 124/84 121/85  Pulse:  75 79 78  Resp:  (!) 24 (!) 22 (!) 27  Temp:  97.9 F (36.6 C)  97.6 F (36.4 C)  TempSrc:  Oral  Oral  SpO2: 98% 97% 97% 98%  Weight:      Height:        Intake/Output Summary (Last 24 hours) at 02/05/2019 1101 Last data filed at 02/05/2019 3614 Gross per 24 hour  Intake 100 ml  Output 500 ml  Net -400 ml   Last 3 Weights 02/04/2019  Weight (lbs) 120 lb 13 oz  Weight (kg) 54.8 kg      Telemetry    NSR - Personally Reviewed  ECG    SR at rate of 80 bpm, TWI laterally with repolarization abnormality and LVH - Personally Reviewed  Physical Exam   GEN: Thin frail male in no acute distress.   Neck: No JVD Cardiac: RRR, no murmurs, rubs, or gallops.  Respiratory: Clear to auscultation bilaterally. GI: Soft, nontender, non-distended  MS: No edema; No deformity. Neuro:  Nonfocal  Psych: Normal affect   Labs     Chemistry Recent Labs  Lab 02/04/19 2119 02/05/19 0228  NA 131* 135  K 3.8  3.8  CL 92* 97*  CO2 25 29  GLUCOSE 227* 223*  BUN 31* 32*  CREATININE 0.90 1.03  CALCIUM 8.3* 8.5*  PROT 5.8*  --   ALBUMIN 2.0*  --   AST 19  --   ALT 22  --   ALKPHOS 66  --   BILITOT 0.3  --   GFRNONAA >60 >60  GFRAA >60 >60  ANIONGAP 14 9     Hematology Recent Labs  Lab 02/04/19 2119 02/05/19 0228  WBC 18.2* 17.1*  RBC 4.39 4.35  HGB 13.8 13.6  HCT 41.3 41.0  MCV 94.1 94.3  MCH 31.4 31.3  MCHC 33.4 33.2  RDW 13.6 13.6  PLT 394 390    BNP Recent Labs  Lab 02/04/19 2119  BNP 1,550.4*     Radiology    No results found.  Cardiac Studies   Pending echo here  Patient Profile     53 y.o. male with history of CAD status post angioplasty only remotely, hypertension, diabetes, hyperlipidemia, noncompliance and ongoing tobacco smoking transferred from [facility] for possible cardiac catheterization.  History of angioplasty in 2015.  Previously followed by Dr. Geraldo Pitter, last seen in January 2016.  He was presented to  [facility] with few days history of chest pain, shortness of breath, weight gain and lower extremity edema.  He was admitted for sepsis secondary to hypoxic respiratory failure and pneumonia with parapneumonic effusion.  He was diuresed.  CT of the chest without PE, 1.4 cm left lower lobe nodule, 4 cm ascending thoracic aneurysm.  BNP 34,000.  Troponin with flat trend. COVID negative.  ° °Stress test: Enlarge infarct involving inferior lateral wall without reversible ischemia, LV function 26%.  High risk stress test ° °Echocardiogram: LV function moderately impaired at 35 to 40%, LVH, diffuse hypokinesis, mild to moderate aortic regurgitation and mild mitral regurgitation. ° °Patient reports improved breathing and chest pain.  Not taking his medication for many months as he unable to get appointment with MD.  No cardiology follow-up for greater than 4 years. ° ° °Assessment & Plan  °  °1.  Elevated troponin and chest pain in setting of  pneumonia °-Peak of troponin 0.13 at outside hospital.  EKG with T wave inversion laterally with LVH and repolarization abnormality. °-Stress test high risk without reversible ischemia. °-Echocardiogram with LV function of 35 to 40% and diffuse hypokinesis °-Will review plan with MD.  Likely left and Right heart cath. Keep NPO.  ° °2.  CAD status post remote angioplasty °-No cardiology follow-up since 2016 °-Continue aspirin, statin overall XL, lisinopril, Imdur ° °3.  Ascending thoracic aortic aneurysm °-4 cm by CT of chest °-Follow-up with yearly evaluation ° °4.  Tobacco smoking °-Encourage cessation ° °5.  Diabetes mellitus °-Hemoglobin A1c 8.7 °-Per primary team °-Consider SPGL2 given history of CAD but cost be issue ° °6.  Hypertension °-Pressure stable on current medication ° °7.  Chronic systolic heart failure °-Echocardiogram with LV function of 35 to 40%.  He was IV diuresis at outside hospital °-Euvolemic by exam currently °-Continue beta-blocker and ACE inhibitor °-Will need diuretics at discharge ° °8.  Pneumonia °-Per primary team ° °For questions or updates, please contact [facility] °Please consult [email or web address] for contact info under  ° °  °   °Signed, °Bhavinkumar Bhagat, PA  °02/05/2019, 11:01 AM   ° °I have examined the patient and reviewed assessment and plan and discussed with patient.  Agree with above as stated.  I spoke with pulmonary MD and personally reviewed the chest CT.  ° °D/w pulmonary team.  They suspect empyema and plan for chest tube. His chest pain was atypical and mostly right sided.  THis has been going on for several months.  EF may be down from combination of infection and noncompliance.  ° °Will hold off on cath for now.  Will see how he responds to chest tube.  Do not want to commit him to DAPT at this point, in the event that any more invasive management is needed for his empyema.   ° °Will follow along.  Patient aware of postponement of cath.   ° °Alayiah Fontes ° °

## 2019-02-05 NOTE — Plan of Care (Signed)
  Problem: Education: Goal: Ability to demonstrate management of disease process will improve Outcome: Progressing Goal: Ability to verbalize understanding of medication therapies will improve Outcome: Progressing   Problem: Activity: Goal: Capacity to carry out activities will improve Outcome: Progressing   Problem: Cardiac: Goal: Ability to achieve and maintain adequate cardiopulmonary perfusion will improve Outcome: Progressing   Problem: Education: Goal: Knowledge of General Education information will improve Description: Including pain rating scale, medication(s)/side effects and non-pharmacologic comfort measures Outcome: Progressing   Problem: Health Behavior/Discharge Planning: Goal: Ability to manage health-related needs will improve Outcome: Progressing   Problem: Clinical Measurements: Goal: Ability to maintain clinical measurements within normal limits will improve Outcome: Progressing Goal: Will remain free from infection Outcome: Progressing Goal: Respiratory complications will improve Outcome: Progressing Goal: Cardiovascular complication will be avoided Outcome: Progressing   Problem: Activity: Goal: Risk for activity intolerance will decrease Outcome: Progressing   Problem: Nutrition: Goal: Adequate nutrition will be maintained Outcome: Progressing   Problem: Pain Managment: Goal: General experience of comfort will improve Outcome: Progressing   Problem: Safety: Goal: Ability to remain free from injury will improve Outcome: Progressing   Problem: Skin Integrity: Goal: Risk for impaired skin integrity will decrease Outcome: Progressing

## 2019-02-05 NOTE — Progress Notes (Signed)
  Echocardiogram 2D Echocardiogram has been performed.  Mark Pittman 02/05/2019, 3:33 PM

## 2019-02-05 NOTE — Progress Notes (Signed)
  Echocardiogram 2D Echocardiogram has been performed.  Mark Pittman 02/05/2019, 3:32 PM

## 2019-02-05 NOTE — Progress Notes (Signed)
Initial Nutrition Assessment  DOCUMENTATION CODES:   Severe malnutrition in context of chronic illness  INTERVENTION:   - Continue Ensure Enlive po BID, each supplement provides 350 kcal and 20 grams of protein  - Magic cup BID with meals, each supplement provides 290 kcal and 9 grams of protein  - MVI with minerals daily  NUTRITION DIAGNOSIS:   Severe Malnutrition related to chronic illness (CHF) as evidenced by moderate fat depletion, severe fat depletion, moderate muscle depletion, severe muscle depletion.  GOAL:   Patient will meet greater than or equal to 90% of their needs  MONITOR:   Labs, PO intake, Supplement acceptance, Weight trends, I & O's, Skin  REASON FOR ASSESSMENT:   Malnutrition Screening Tool    ASSESSMENT:   53 year old male who presented on 8/02 from [facility]. PMH of CAD, HTN, CHF, DM, MI, tobacco use. Pt found to have right-sided parapneumonic effusion and lung nodules. Possible NSTEMI.   Chest tube placed today for empyema. Plan for cardiac cath at some point.  RD spoke with pt at bedside. Pt states that he is hungry at this time and is waiting on lunch that he ordered. Pt was NPO this AM and has not yet eaten today. RD provided pt with an Ensure Enlive oral nutrition supplement.  Pt reports that he has had a good appetite and has wanted to eat but been fearful of feeling "too full" and feeling chest/abdominal pressure when eating. Pt states that after he eats, he "blows up." Pt noticed a decrease in his po intake that started 3-4 months ago. Pt states he still ate 3 meals daily but that they were smaller meals or he would "graze" throughout the day.  Pt shares that his UBW is 145-150 lbs and that he last weighed this 3-4 months ago. No weight history available in chart. Current weight is 120.8 lbs. RD will monitor trends during admission.  Pt amenable to receiving oral nutrition supplements during admission. Ensure Jeanne Ivan is currently  ordered BID. RD will also order Magic Cup on [place]. RD encouraged PO intake with a focus on high-protein foods to aid pt in meeting kcal and protein needs, maintaining lean muscle mass, and preventing further weight loss.  Medications reviewed and include: Ensure Enlive BID, SSI, IV abx  Labs reviewed: BUN 32 CBG's: 201-276  NUTRITION - FOCUSED PHYSICAL EXAM:    Most Recent Value  Orbital Region  Moderate depletion  Upper Arm Region  Severe depletion  Thoracic and Lumbar Region  Severe depletion  Buccal Region  Moderate depletion  Temple Region  Severe depletion  Clavicle Bone Region  Severe depletion  Clavicle and Acromion Bone Region  Severe depletion  Scapular Bone Region  Severe depletion  Dorsal Hand  Moderate depletion  Patellar Region  Severe depletion  Anterior Thigh Region  Severe depletion  Posterior Calf Region  Severe depletion  Edema (RD Assessment)  None  Hair  Reviewed  Eyes  Reviewed  Mouth  Reviewed  Skin  Reviewed  Nails  Reviewed       Diet Order:   Diet Order            Diet heart healthy/carb modified Room service appropriate? Yes; Fluid consistency: Thin  Diet effective now              EDUCATION NEEDS:   Education needs have been addressed  Skin:  Skin Assessment: Reviewed RN Assessment  Last BM:  02/04/19  Height:   Ht Readings from  Last 1 Encounters:  02/04/19 5\' 6"  (1.676 m)    Weight:   Wt Readings from Last 1 Encounters:  02/04/19 54.8 kg    Ideal Body Weight:  64.5 kg  BMI:  Body mass index is 19.5 kg/m.  Estimated Nutritional Needs:   Kcal:  1900-2100  Protein:  80-95 grams  Fluid:  >/= 1.9 L    Earma Reading, MS, RD, LDN Inpatient Clinical Dietitian Pager: [phone] Weekend/After Hours: [phone]

## 2019-02-05 NOTE — Consult Note (Signed)
NAME:  Mark Pittman, MRN:  [id number], DOB:  [birth year], LOS: 1 ADMISSION DATE:  02/04/2019, CONSULTATION DATE:  02/05/19 REFERRING MD:  Sloan Leiter, CHIEF COMPLAINT:  SOB   Brief History   53 year old man presenting with constitutional symptoms found to have large complex R pleural effusion and possible NSTEMI.  History of present illness   53 year old smoker who is presenting with a couple months of insidious onset SOB, dry cough, pleurisy, and recently LE edema.  Imaging at [facility] revealed large complex R effusion.  Also noted to have mildly elevated troponin and very high BNP.  Nuclear stress with fixed defect, depressed EF, echo with diffuse hypokinesis EF 35-40%.  Cardiology has evaluated here and plan at some point to cath.   Past Medical History  CAD Prior MI Smoker DM  Significant Hospital Events   8/2 admitted 8/3 chest tube  Consults:  PCCM, Cardiology  Procedures:  8/3 chest tube  Significant Diagnostic Tests:  CT personally reviewed: no PE, small left effusion, dilated atria, large complex R pleural effusion.  There are a couple GG nodules on L and maybe and enlarged 7 node.  Micro Data:  Body fluid 02/05/19>>  Antimicrobials:  Unasyn 8/3>>   Interim history/subjective:  Admitted  Objective   Blood pressure 121/85, pulse 78, temperature 97.6 F (36.4 C), temperature source Oral, resp. rate (!) 27, height 5\' 6"  (1.676 m), weight 54.8 kg, SpO2 98 %.        Intake/Output Summary (Last 24 hours) at 02/05/2019 1137 Last data filed at 02/05/2019 3903 Gross per 24 hour  Intake 100 ml  Output 500 ml  Net -400 ml   Filed Weights   02/04/19 2013  Weight: 54.8 kg    Examination: General: cachetic man in NAD HENT: MM dry, unable to get good look at his teeth Lungs: Diminished on R, no accessory muscle use, large complex  Cardiovascular: RRR, ext war, Abdomen: soft, +BS Extremities: no edema Neuro: moves all 4 ext to command Skin: no rashes  Resolved  Hospital Problem list   NA  Assessment & Plan:  # R empyema- in setting of poorly controlled DM; likely cause of chest pains and B symptoms # L lung nodules- hopefully reactive, will need f/u imaging # Unintentional weight loss- suspicion related to ongoing infection more than anything else # New CHF- needs LHC at some point, would hold off for now until we get infection better controlled as he may need additional procedures that DAPT may increase risk # Smoking- knows he needs to stop # DM- A1c 8.7% # Chronic benzo use  - Chest tube, to suction - Send usual labs including cytology - Standing toradol, PRN perocet (moderate), fentanyl (severe) pain - Now and AM CXR - Will follow with you, probably will need lytics depending on drainage - Switch cefepime to unasyn to cover anaerobes, will likely need prolonged therapy but can usually get away with PO once organism isolated  Best practice:  Diet: cardiac/diabetic Pain/Anxiety/Delirium protocol (if indicated): NA VAP protocol (if indicated): NA DVT prophylaxis: heparin GI prophylaxis: start pepcid Glucose control: per primary Mobility: up as able Code Status: full Family Communication: per primary Disposition: PCU  Labs   CBC: Recent Labs  Lab 02/04/19 2119 02/05/19 0228  WBC 18.2* 17.1*  NEUTROABS 15.1*  --   HGB 13.8 13.6  HCT 41.3 41.0  MCV 94.1 94.3  PLT 394 009    Basic Metabolic Panel: Recent Labs  Lab 02/04/19 2119 02/05/19  0228  NA 131* 135  K 3.8 3.8  CL 92* 97*  CO2 25 29  GLUCOSE 227* 223*  BUN 31* 32*  CREATININE 0.90 1.03  CALCIUM 8.3* 8.5*  MG 2.2  --   PHOS 3.7  --    GFR: Estimated Creatinine Clearance: 64.3 mL/min (by C-G formula based on SCr of 1.03 mg/dL). Recent Labs  Lab 02/04/19 2119 02/05/19 0228  WBC 18.2* 17.1*    Liver Function Tests: Recent Labs  Lab 02/04/19 2119  AST 19  ALT 22  ALKPHOS 66  BILITOT 0.3  PROT 5.8*  ALBUMIN 2.0*   No results for input(s): LIPASE,  AMYLASE in the last 168 hours. No results for input(s): AMMONIA in the last 168 hours.  ABG No results found for: PHART, PCO2ART, PO2ART, HCO3, TCO2, ACIDBASEDEF, O2SAT   Coagulation Profile: Recent Labs  Lab 02/04/19 2119  INR 1.2    Cardiac Enzymes: No results for input(s): CKTOTAL, CKMB, CKMBINDEX, TROPONINI in the last 168 hours.  HbA1C: Hgb A1c MFr Bld  Date/Time Value Ref Range Status  02/04/2019 09:19 PM 8.7 (H) 4.8 - 5.6 % Final    Comment:    (NOTE) Pre diabetes:          5.7%-6.4% Diabetes:              >6.4% Glycemic control for   <7.0% adults with diabetes     CBG: Recent Labs  Lab 02/04/19 1830 02/04/19 2218 02/05/19 0631 02/05/19 1043  GLUCAP 276* 270* 201* 172*    Review of Systems:    Positive Symptoms in bold:  Constitutional fevers, chills, weight loss, fatigue, anorexia, malaise  Eyes decreased vision, double vision, eye irritation  Ears, Nose, Mouth, Throat sore throat, trouble swallowing, sinus congestion  Cardiovascular chest pain, paroxysmal nocturnal dyspnea, lower ext edema, palpitations   Respiratory SOB, cough, DOE, hemoptysis, wheezing  Gastrointestinal nausea, vomiting, diarrhea  Genitourinary burning with urination, trouble urinating  Musculoskeletal joint aches, joint swelling, back pain  Integumentary  rashes, skin lesions  Neurological focal weakness, focal numbness, trouble speaking, headaches  Psychiatric depression, anxiety, confusion  Endocrine polyuria, polydipsia, cold intolerance, heat intolerance  Hematologic abnormal bruising, abnormal bleeding, unexplained nose bleeds  Allergic/Immunologic recurrent infections, hives, swollen lymph nodes     Past Medical History  He,  has a past medical history of CHF (congestive heart failure), NYHA class I (HCC) (2005), Diabetes (HCC) (2018), MI (myocardial infarction) (HCC) (2015), and Smoker (1987).   Surgical History   None  Social History   reports that he quit  smoking 3 days ago. He has a 15.00 pack-year smoking history. He has never used smokeless tobacco. He reports previous alcohol use. He reports current drug use. Frequency: 2.00 times per week. Drug: Marijuana.   Family History    Father COPD  Allergies No Known Allergies   Home Medications  Prior to Admission medications   Medication Sig Start Date End Date Taking? Authorizing Provider  amLODipine (NORVASC) 10 MG tablet Take 10 mg by mouth daily.   Yes [provider]  clonazePAM (KLONOPIN) 1 MG tablet Take 1 mg by mouth 2 (two) times daily as needed for anxiety.   Yes [provider]  lisinopril-hydrochlorothiazide (ZESTORETIC) 20-12.5 MG tablet Take 1 tablet by mouth 2 (two) times a day.   Yes [provider]  METFORMIN HCL PO Take 1 tablet by mouth 2 (two) times a day.   Yes [provider]  metoprolol tartrate (LOPRESSOR) 100 MG tablet  Take 100 mg by mouth 2 (two) times daily.   Yes [provider]  PRESCRIPTION MEDICATION Take 1 tablet by mouth daily.   Yes [provider]    Total of 70 minutes

## 2019-02-05 NOTE — Procedures (Signed)
Procedure: instillation of pleural fibrinolytics Indication: loculated pleural effusion Description: 10mg of tPA in 30cc of saline and 5mg of dornase in 30cc of sterile water were injected into pleural space using existing pleural catheter.  30cc of saline were used to flush out dead space after.  Catheter will be clamped for 1 hour and then back to suction. Complications: none immediate  Dan Doniesha Landau MD  

## 2019-02-05 NOTE — Progress Notes (Signed)
Chest tube placed to 20 suction by Dr. Tamala Julian. RN advised of bacteria in pt pleural fluid.

## 2019-02-05 NOTE — Progress Notes (Signed)
Inpatient Diabetes Program Recommendations  AACE/ADA: New Consensus Statement on Inpatient Glycemic Control (2015)  Target Ranges:  Prepandial:   less than 140 mg/dL      Peak postprandial:   less than 180 mg/dL (1-2 hours)      Critically ill patients:  140 - 180 mg/dL   Lab Results  Component Value Date   GLUCAP 201 (H) 02/05/2019   HGBA1C 8.7 (H) 02/04/2019    Review of Glycemic Control  Diabetes history: DM 2 Outpatient Diabetes medications: Metformin BID listed in med rec Current orders for Inpatient glycemic control: Glipizide 5 mg bid, Novolog 0-9 units tid, Novolog 0-5 units qhs  A1c 8.7% on 8/2  Inpatient Diabetes Program Recommendations:    Patient is NPO. Due to risk of hypoglycemia consider d/cing Glipizide and increase Novolog Correction scale to Moderate 0-15 units tid.  Thanks,  Tama Headings RN, MSN, BC-ADM Inpatient Diabetes Coordinator Team Pager [phone] (8a-5p)

## 2019-02-05 NOTE — Procedures (Signed)
Chest Tube Insertion Procedure Note  Indications:  Clinically significant Empyema  Pre-operative Diagnosis: Empyema  Post-operative Diagnosis: Empyema  Procedure Details  Informed consent was obtained for the procedure, including sedation.  Risks of lung perforation, hemorrhage, arrhythmia, and adverse drug reaction were discussed.   After sterile skin prep, using standard technique, a 16 French tube was placed in the right lateral 8th  rib space.  Findings: 1400 ml of purulent fluid obtained  Estimated Blood Loss:  Minimal         Specimens:  Sent purulent fluid              Complications:  None; patient tolerated the procedure well.         Disposition: step down          Condition: stable  Erick Colace ACNP-BC [place] Pager # [phone] OR # [phone] if no answer

## 2019-02-05 NOTE — Plan of Care (Signed)

## 2019-02-05 NOTE — Progress Notes (Addendum)
PROGRESS NOTE    Mark Pittman  DGL:[id number] DOB: Mar 28, 1966 DOA: 02/04/2019 PCP: Patient, No Pcp Per    Brief Narrative:  53 year old gentleman with history of coronary artery disease, hypertension, congestive heart failure with ejection fraction of 40%, type 2 diabetes on insulin who was admitted to [facility] 2 days ago with about 2 months history of shortness of breath, chest pain, leg swelling in the context of noncompliance to medication and follow-ups.  He was admitted at [facility], had abnormal Lexiscan, also found to have low-grade fever with right-sided loculated pleural effusion, CHF with EF of 35% with recently known normal ejection fraction, mildly elevated troponins.  A CTA of the chest also showed right pleural effusion, left lower lobe tumor but no PE.  For his cardiac and pulmonary work-up, he was transferred to [place]:   Active Problems:   Parapneumonic effusion  Presumed pneumonia with right-sided asymmetrical pleural effusion: Suspect parapneumonic effusion versus CHF.  Patient had received vancomycin and cefepime at outside hospital.  Cultures negative.  MRSA negative.  Continue cefepime until clinical improvement.  Ordered IR guided thoracentesis today with labs including cytology.  Will repeat chest x-ray after thoracentesis.  Will review CT scan from [place].  Discussed with pulmonology and they will see patient in follow-up.  Continue chest physiotherapy and incentive spirometry.  Oxygen to keep saturations more than 90%.  New onset congestive heart failure, abnormal stress test: Currently chest pain-free.  Patient remains on aspirin, ACE inhibitor's beta-blockers and a statin.  Followed by cardiology.  Planning for cardiac cath with history of coronary artery disease.  Patient remains on Lasix and tolerating well.  Lung nodules: A smoker, 2 months of weight loss and low appetite.  No sputum production.  Will repeat CT  scan after thoracentesis.  Patient may need PET scan.  Followed by pulmonology.  Type 2 diabetes: Fairly controlled.  On insulin.  Smoker: Counseled to quit.  He is motivated.  Hypertension: Blood pressures stable.  He is on lisinopril now.    DVT prophylaxis: heparin sq Code Status: Full code Family Communication: None Disposition Plan: Home after hospitalization   Consultants:   Cardiology  PCCM  Procedures:   None    Antimicrobials:   Cefepime, 02/04/2019  Vancomycin, 02/02/2019-02/03/2019   Subjective: Patient seen and examined.  Denied any overnight events.  He was not short of breath at rest.  Feels weak.  Afebrile.  Denies any chest pain, however he has some dull heaviness on the anterior chest.  No more leg swelling.  Objective: Vitals:   02/05/19 0737 02/05/19 0751 02/05/19 0911 02/05/19 1042  BP:  106/75 124/84 121/85  Pulse:  75 79 78  Resp:  (!) 24 (!) 22 (!) 27  Temp:  97.9 F (36.6 C)  97.6 F (36.4 C)  TempSrc:  Oral  Oral  SpO2: 98% 97% 97% 98%  Weight:      Height:        Intake/Output Summary (Last 24 hours) at 02/05/2019 1043 Last data filed at 02/05/2019 0914 Gross per 24 hour  Intake 100 ml  Output 500 ml  Net -400 ml   Filed Weights   02/04/19 2013  Weight: 54.8 kg    Examination:  General exam: Appears calm and comfortable, chronically sick looking and thinly built.  On minimum oxygen with good saturations. Respiratory system: Clear to auscultation. Respiratory effort normal.  No air entry at the right basis. Cardiovascular system: S1 &  S2 heard, RRR. No JVD, murmurs, rubs, gallops or clicks. No pedal edema. Gastrointestinal system: Abdomen is nondistended, soft and nontender. No organomegaly or masses felt. Normal bowel sounds heard. Central nervous system: Alert and oriented. No focal neurological deficits. Extremities: Symmetric 5 x 5 power. Skin: No rashes, lesions or ulcers Psychiatry: Judgement and insight appear normal.  Mood & affect appropriate.     Data Reviewed: I have personally reviewed following labs and imaging studies  CBC: Recent Labs  Lab 02/04/19 2119 02/05/19 0228  WBC 18.2* 17.1*  NEUTROABS 15.1*  --   HGB 13.8 13.6  HCT 41.3 41.0  MCV 94.1 94.3  PLT 394 390   Basic Metabolic Panel: Recent Labs  Lab 02/04/19 2119 02/05/19 0228  NA 131* 135  K 3.8 3.8  CL 92* 97*  CO2 25 29  GLUCOSE 227* 223*  BUN 31* 32*  CREATININE 0.90 1.03  CALCIUM 8.3* 8.5*  MG 2.2  --   PHOS 3.7  --    GFR: Estimated Creatinine Clearance: 64.3 mL/min (by C-G formula based on SCr of 1.03 mg/dL). Liver Function Tests: Recent Labs  Lab 02/04/19 2119  AST 19  ALT 22  ALKPHOS 66  BILITOT 0.3  PROT 5.8*  ALBUMIN 2.0*   No results for input(s): LIPASE, AMYLASE in the last 168 hours. No results for input(s): AMMONIA in the last 168 hours. Coagulation Profile: Recent Labs  Lab 02/04/19 2119  INR 1.2   Cardiac Enzymes: No results for input(s): CKTOTAL, CKMB, CKMBINDEX, TROPONINI in the last 168 hours. BNP (last 3 results) No results for input(s): PROBNP in the last 8760 hours. HbA1C: Recent Labs    02/04/19 2119  HGBA1C 8.7*   CBG: Recent Labs  Lab 02/04/19 1830 02/04/19 2218 02/05/19 0631  GLUCAP 276* 270* 201*   Lipid Profile: No results for input(s): CHOL, HDL, LDLCALC, TRIG, CHOLHDL, LDLDIRECT in the last 72 hours. Thyroid Function Tests: Recent Labs    02/04/19 2119  TSH 1.910   Anemia Panel: No results for input(s): VITAMINB12, FOLATE, FERRITIN, TIBC, IRON, RETICCTPCT in the last 72 hours. Sepsis Labs: No results for input(s): PROCALCITON, LATICACIDVEN in the last 168 hours.  Recent Results (from the past 240 hour(s))  MRSA PCR Screening     Status: None   Collection Time: 02/04/19  6:09 PM   Specimen: Nasopharyngeal  Result Value Ref Range Status   MRSA by PCR NEGATIVE NEGATIVE Final    Comment:        The GeneXpert MRSA Assay (FDA approved for NASAL  specimens only), is one component of a comprehensive MRSA colonization surveillance program. It is not intended to diagnose MRSA infection nor to guide or monitor treatment for MRSA infections. Performed at [facility], [address] [place], [place] [address]   SARS Coronavirus 2 [facility](Hospital order, Performed in [facility] hospital lab)     Status: None   Collection Time: 02/05/19  9:07 AM  Result Value Ref Range Status   SARS Coronavirus 2 NEGATIVE NEGATIVE Final    Comment: (NOTE) If result is NEGATIVE SARS-CoV-2 target nucleic acids are NOT DETECTED. The SARS-CoV-2 RNA is generally detectable in upper and lower  respiratory specimens during the acute phase of infection. The lowest  concentration of SARS-CoV-2 viral copies this assay can detect is 250  copies / mL. A negative result does not preclude SARS-CoV-2 infection  and should not be used as the sole basis for treatment or other  patient management decisions.  A negative result may occur with  improper specimen collection / handling, submission of specimen other  than nasopharyngeal swab, presence of viral mutation(s) within the  areas targeted by this assay, and inadequate number of viral copies  (<250 copies / mL). A negative result must be combined with clinical  observations, patient history, and epidemiological information. If result is POSITIVE SARS-CoV-2 target nucleic acids are DETECTED. The SARS-CoV-2 RNA is generally detectable in upper and lower  respiratory specimens dur ing the acute phase of infection.  Positive  results are indicative of active infection with SARS-CoV-2.  Clinical  correlation with patient history and other diagnostic information is  necessary to determine patient infection status.  Positive results do  not rule out bacterial infection or co-infection with other viruses. If result is PRESUMPTIVE POSTIVE SARS-CoV-2 nucleic acids MAY BE PRESENT.   A presumptive positive result was  obtained on the submitted specimen  and confirmed on repeat testing.  While 2019 novel coronavirus  (SARS-CoV-2) nucleic acids may be present in the submitted sample  additional confirmatory testing may be necessary for epidemiological  and / or clinical management purposes  to differentiate between  SARS-CoV-2 and other Sarbecovirus currently known to infect humans.  If clinically indicated additional testing with an alternate test  methodology [phone]) is advised. The SARS-CoV-2 RNA is generally  detectable in upper and lower respiratory sp ecimens during the acute  phase of infection. The expected result is Negative. Fact Sheet for Patients:  BoilerBrush.com.cy Fact Sheet for Healthcare Providers: https://pope.com/ This test is not yet approved or cleared by the [place] FDA and has been authorized for detection and/or diagnosis of SARS-CoV-2 by FDA under an Emergency Use Authorization (EUA).  This EUA will remain in effect (meaning this test can be used) for the duration of the COVID-19 declaration under Section 564(b)(1) of the Act, 21 U.S.C. section 360bbb-3(b)(1), unless the authorization is terminated or revoked sooner. Performed at [facility], [address] [place], [place] [address]          Radiology Studies: No results found.      Scheduled Meds: . aspirin EC  81 mg Oral Daily  . atorvastatin  40 mg Oral q1800  . budesonide (PULMICORT) nebulizer solution  0.25 mg Nebulization BID  . feeding supplement (ENSURE ENLIVE)  237 mL Oral BID BM  . heparin  5,000 Units Subcutaneous Q8H  . insulin aspart  0-5 Units Subcutaneous QHS  . insulin aspart  0-9 Units Subcutaneous TID WC  . ipratropium-albuterol  3 mL Nebulization Q6H  . isosorbide mononitrate  30 mg Oral Daily  . lisinopril  20 mg Oral Daily  . metoprolol succinate  100 mg Oral Daily   Continuous Infusions: . ceFEPime (MAXIPIME) IV 2 g  (02/05/19 0658)     LOS: 1 day    Time spent: 35 minutes    Dorcas Carrow, MD [facility] Pager [phone]  If 7PM-7AM, please contact night-coverage [email or web address] Password [facility] 02/05/2019, 10:43 AM

## 2019-02-05 NOTE — Progress Notes (Addendum)
Progress Note  Patient Name: Mark Pittman Date of Encounter: 02/05/2019  Primary Cardiologist: New (previously followed by Dr. Marlyn Corporal)  Subjective   Breathing improved. No recurrent chest pain  Inpatient Medications    Scheduled Meds:  aspirin EC  81 mg Oral Daily   atorvastatin  40 mg Oral q1800   budesonide (PULMICORT) nebulizer solution  0.25 mg Nebulization BID   feeding supplement (ENSURE ENLIVE)  237 mL Oral BID BM   heparin  5,000 Units Subcutaneous Q8H   insulin aspart  0-5 Units Subcutaneous QHS   insulin aspart  0-9 Units Subcutaneous TID WC   ipratropium-albuterol  3 mL Nebulization Q6H   isosorbide mononitrate  30 mg Oral Daily   lisinopril  20 mg Oral Daily   metoprolol succinate  100 mg Oral Daily   Continuous Infusions:  ceFEPime (MAXIPIME) IV 2 g (02/05/19 0658)   PRN Meds: clonazePAM, ipratropium-albuterol   Vital Signs    Vitals:   02/05/19 0737 02/05/19 0751 02/05/19 0911 02/05/19 1042  BP:  106/75 124/84 121/85  Pulse:  75 79 78  Resp:  (!) 24 (!) 22 (!) 27  Temp:  97.9 F (36.6 C)  97.6 F (36.4 C)  TempSrc:  Oral  Oral  SpO2: 98% 97% 97% 98%  Weight:      Height:        Intake/Output Summary (Last 24 hours) at 02/05/2019 1101 Last data filed at 02/05/2019 3614 Gross per 24 hour  Intake 100 ml  Output 500 ml  Net -400 ml   Last 3 Weights 02/04/2019  Weight (lbs) 120 lb 13 oz  Weight (kg) 54.8 kg      Telemetry    NSR - Personally Reviewed  ECG    SR at rate of 80 bpm, TWI laterally with repolarization abnormality and LVH - Personally Reviewed  Physical Exam   GEN: Thin frail male in no acute distress.   Neck: No JVD Cardiac: RRR, no murmurs, rubs, or gallops.  Respiratory: Clear to auscultation bilaterally. GI: Soft, nontender, non-distended  MS: No edema; No deformity. Neuro:  Nonfocal  Psych: Normal affect   Labs     Chemistry Recent Labs  Lab 02/04/19 2119 02/05/19 0228  NA 131* 135  K 3.8  3.8  CL 92* 97*  CO2 25 29  GLUCOSE 227* 223*  BUN 31* 32*  CREATININE 0.90 1.03  CALCIUM 8.3* 8.5*  PROT 5.8*  --   ALBUMIN 2.0*  --   AST 19  --   ALT 22  --   ALKPHOS 66  --   BILITOT 0.3  --   GFRNONAA >60 >60  GFRAA >60 >60  ANIONGAP 14 9     Hematology Recent Labs  Lab 02/04/19 2119 02/05/19 0228  WBC 18.2* 17.1*  RBC 4.39 4.35  HGB 13.8 13.6  HCT 41.3 41.0  MCV 94.1 94.3  MCH 31.4 31.3  MCHC 33.4 33.2  RDW 13.6 13.6  PLT 394 390    BNP Recent Labs  Lab 02/04/19 2119  BNP 1,550.4*     Radiology    No results found.  Cardiac Studies   Pending echo here  Patient Profile     53 y.o. male with history of CAD status post angioplasty only remotely, hypertension, diabetes, hyperlipidemia, noncompliance and ongoing tobacco smoking transferred from [facility] for possible cardiac catheterization.  History of angioplasty in 2015.  Previously followed by Dr. Geraldo Pitter, last seen in January 2016.  He was presented to  [facility] with few days history of chest pain, shortness of breath, weight gain and lower extremity edema.  He was admitted for sepsis secondary to hypoxic respiratory failure and pneumonia with parapneumonic effusion.  He was diuresed.  CT of the chest without PE, 1.4 cm left lower lobe nodule, 4 cm ascending thoracic aneurysm.  BNP 34,000.  Troponin with flat trend. COVID negative.   Stress test: Enlarge infarct involving inferior lateral wall without reversible ischemia, LV function 26%.  High risk stress test  Echocardiogram: LV function moderately impaired at 35 to 40%, LVH, diffuse hypokinesis, mild to moderate aortic regurgitation and mild mitral regurgitation.  Patient reports improved breathing and chest pain.  Not taking his medication for many months as he unable to get appointment with MD.  No cardiology follow-up for greater than 4 years.   Assessment & Plan    1.  Elevated troponin and chest pain in setting of  pneumonia -Peak of troponin 0.13 at outside hospital.  EKG with T wave inversion laterally with LVH and repolarization abnormality. -Stress test high risk without reversible ischemia. -Echocardiogram with LV function of 35 to 40% and diffuse hypokinesis -Will review plan with MD.  Likely left and Right heart cath. Keep NPO.   2.  CAD status post remote angioplasty -No cardiology follow-up since 2016 -Continue aspirin, statin overall XL, lisinopril, Imdur  3.  Ascending thoracic aortic aneurysm -4 cm by CT of chest -Follow-up with yearly evaluation  4.  Tobacco smoking -Encourage cessation  5.  Diabetes mellitus -Hemoglobin A1c 8.7 -Per primary team -Consider SPGL2 given history of CAD but cost be issue  6.  Hypertension -Pressure stable on current medication  7.  Chronic systolic heart failure -Echocardiogram with LV function of 35 to 40%.  He was IV diuresis at outside hospital -Euvolemic by exam currently -Continue beta-blocker and ACE inhibitor -Will need diuretics at discharge  8.  Pneumonia -Per primary team  For questions or updates, please contact [facility] Please consult [email or web address] for contact info under        Signed, Manson Passey, PA  02/05/2019, 11:01 AM    I have examined the patient and reviewed assessment and plan and discussed with patient.  Agree with above as stated.  I spoke with pulmonary MD and personally reviewed the chest CT.   D/w pulmonary team.  They suspect empyema and plan for chest tube. His chest pain was atypical and mostly right sided.  THis has been going on for several months.  EF may be down from combination of infection and noncompliance.   Will hold off on cath for now.  Will see how he responds to chest tube.  Do not want to commit him to DAPT at this point, in the event that any more invasive management is needed for his empyema.    Will follow along.  Patient aware of postponement of cath.    Lance Muss

## 2019-02-06 ENCOUNTER — Encounter (HOSPITAL_COMMUNITY)
Admission: AD | Disposition: A | Discharge status: Home / Self Care | Payer: Self-pay | Source: Other Acute Inpatient Hospital | Attending: Internal Medicine

## 2019-02-06 ENCOUNTER — Inpatient Hospital Stay (HOSPITAL_COMMUNITY): Payer: Medicaid Other

## 2019-02-06 ENCOUNTER — Encounter (HOSPITAL_COMMUNITY): Payer: Self-pay | Admitting: Cardiovascular Disease

## 2019-02-06 DIAGNOSIS — I255 Ischemic cardiomyopathy: Secondary | ICD-10-CM

## 2019-02-06 DIAGNOSIS — I2510 Atherosclerotic heart disease of native coronary artery without angina pectoris: Secondary | ICD-10-CM

## 2019-02-06 DIAGNOSIS — R0602 Shortness of breath: Secondary | ICD-10-CM

## 2019-02-06 HISTORY — PX: RIGHT/LEFT HEART CATH AND CORONARY ANGIOGRAPHY: CATH118266

## 2019-02-06 LAB — GLUCOSE, CAPILLARY
Glucose-Capillary: 163 mg/dL — ABNORMAL HIGH (ref 70–99)
Glucose-Capillary: 251 mg/dL — ABNORMAL HIGH (ref 70–99)
Glucose-Capillary: 321 mg/dL — ABNORMAL HIGH (ref 70–99)
Glucose-Capillary: 345 mg/dL — ABNORMAL HIGH (ref 70–99)
Glucose-Capillary: 402 mg/dL — ABNORMAL HIGH (ref 70–99)

## 2019-02-06 LAB — POCT I-STAT EG7
Acid-Base Excess: 5 mmol/L — ABNORMAL HIGH (ref 0.0–2.0)
Acid-Base Excess: 7 mmol/L — ABNORMAL HIGH (ref 0.0–2.0)
Bicarbonate: 32.2 mmol/L — ABNORMAL HIGH (ref 20.0–28.0)
Bicarbonate: 34 mmol/L — ABNORMAL HIGH (ref 20.0–28.0)
Calcium, Ion: 1.24 mmol/L (ref 1.15–1.40)
Calcium, Ion: 1.28 mmol/L (ref 1.15–1.40)
HCT: 39 % (ref 39.0–52.0)
HCT: 39 % (ref 39.0–52.0)
Hemoglobin: 13.3 g/dL (ref 13.0–17.0)
Hemoglobin: 13.3 g/dL (ref 13.0–17.0)
O2 Saturation: 77 %
O2 Saturation: 80 %
Potassium: 3.8 mmol/L (ref 3.5–5.1)
Potassium: 3.9 mmol/L (ref 3.5–5.1)
Sodium: 137 mmol/L (ref 135–145)
Sodium: 138 mmol/L (ref 135–145)
TCO2: 34 mmol/L — ABNORMAL HIGH (ref 22–32)
TCO2: 36 mmol/L — ABNORMAL HIGH (ref 22–32)
pCO2, Ven: 57.9 mmHg (ref 44.0–60.0)
pCO2, Ven: 58.3 mmHg (ref 44.0–60.0)
pH, Ven: 7.353 (ref 7.250–7.430)
pH, Ven: 7.373 (ref 7.250–7.430)
pO2, Ven: 45 mmHg (ref 32.0–45.0)
pO2, Ven: 46 mmHg — ABNORMAL HIGH (ref 32.0–45.0)

## 2019-02-06 LAB — POCT I-STAT 7, (LYTES, BLD GAS, ICA,H+H)
Acid-Base Excess: 5 mmol/L — ABNORMAL HIGH (ref 0.0–2.0)
Bicarbonate: 31.3 mmol/L — ABNORMAL HIGH (ref 20.0–28.0)
Calcium, Ion: 1.26 mmol/L (ref 1.15–1.40)
HCT: 38 % — ABNORMAL LOW (ref 39.0–52.0)
Hemoglobin: 12.9 g/dL — ABNORMAL LOW (ref 13.0–17.0)
O2 Saturation: 99 %
Potassium: 3.8 mmol/L (ref 3.5–5.1)
Sodium: 138 mmol/L (ref 135–145)
TCO2: 33 mmol/L — ABNORMAL HIGH (ref 22–32)
pCO2 arterial: 53.9 mmHg — ABNORMAL HIGH (ref 32.0–48.0)
pH, Arterial: 7.371 (ref 7.350–7.450)
pO2, Arterial: 149 mmHg — ABNORMAL HIGH (ref 83.0–108.0)

## 2019-02-06 LAB — CBC WITH DIFFERENTIAL/PLATELET
Abs Immature Granulocytes: 0.05 10*3/uL (ref 0.00–0.07)
Basophils Absolute: 0 10*3/uL (ref 0.0–0.1)
Basophils Relative: 0 %
Eosinophils Absolute: 0.5 10*3/uL (ref 0.0–0.5)
Eosinophils Relative: 4 %
HCT: 40.4 % (ref 39.0–52.0)
Hemoglobin: 13.5 g/dL (ref 13.0–17.0)
Immature Granulocytes: 1 %
Lymphocytes Relative: 11 %
Lymphs Abs: 1.2 10*3/uL (ref 0.7–4.0)
MCH: 31.6 pg (ref 26.0–34.0)
MCHC: 33.4 g/dL (ref 30.0–36.0)
MCV: 94.6 fL (ref 80.0–100.0)
Monocytes Absolute: 0.9 10*3/uL (ref 0.1–1.0)
Monocytes Relative: 8 %
Neutro Abs: 8.3 10*3/uL — ABNORMAL HIGH (ref 1.7–7.7)
Neutrophils Relative %: 76 %
Platelets: 338 10*3/uL (ref 150–400)
RBC: 4.27 MIL/uL (ref 4.22–5.81)
RDW: 13.7 % (ref 11.5–15.5)
WBC: 10.9 10*3/uL — ABNORMAL HIGH (ref 4.0–10.5)
nRBC: 0 % (ref 0.0–0.2)

## 2019-02-06 LAB — BASIC METABOLIC PANEL
Anion gap: 11 (ref 5–15)
BUN: 42 mg/dL — ABNORMAL HIGH (ref 6–20)
CO2: 26 mmol/L (ref 22–32)
Calcium: 8.4 mg/dL — ABNORMAL LOW (ref 8.9–10.3)
Chloride: 97 mmol/L — ABNORMAL LOW (ref 98–111)
Creatinine, Ser: 1.18 mg/dL (ref 0.61–1.24)
GFR calc Af Amer: >60 mL/min (ref >60)
GFR calc non Af Amer: >60 mL/min (ref >60)
Glucose, Bld: 273 mg/dL — ABNORMAL HIGH (ref 70–99)
Potassium: 4.2 mmol/L (ref 3.5–5.1)
Sodium: 134 mmol/L — ABNORMAL LOW (ref 135–145)

## 2019-02-06 LAB — TRIGLYCERIDES, BODY FLUIDS: Triglycerides, Fluid: 38 mg/dL

## 2019-02-06 SURGERY — RIGHT/LEFT HEART CATH AND CORONARY ANGIOGRAPHY
Anesthesia: LOCAL

## 2019-02-06 MED ORDER — VERAPAMIL HCL 2.5 MG/ML IV SOLN
INTRAVENOUS | Status: DC | PRN
  Administered 2019-02-06: 10 mL via INTRA_ARTERIAL

## 2019-02-06 MED ORDER — VERAPAMIL HCL 2.5 MG/ML IV SOLN
INTRAVENOUS | Status: AC
  Filled 2019-02-06: qty 2

## 2019-02-06 MED ORDER — MIDAZOLAM HCL 2 MG/2ML IJ SOLN
INTRAMUSCULAR | Status: DC | PRN
  Administered 2019-02-06 (×2): 1 mg via INTRAVENOUS

## 2019-02-06 MED ORDER — DIAZEPAM 5 MG PO TABS
5.0000 mg | ORAL_TABLET | Freq: Four times a day (QID) | ORAL | Status: DC | PRN
  Administered 2019-02-07: 5 mg via ORAL
  Filled 2019-02-06: qty 1

## 2019-02-06 MED ORDER — INSULIN ASPART 100 UNIT/ML ~~LOC~~ SOLN
10.0000 [IU] | Freq: Once | SUBCUTANEOUS | Status: AC
  Administered 2019-02-06: 22:00:00 10 [IU] via SUBCUTANEOUS

## 2019-02-06 MED ORDER — SODIUM CHLORIDE 0.9 % IV SOLN: 250.0000 mL | INTRAVENOUS | Status: DC | PRN

## 2019-02-06 MED ORDER — HEPARIN (PORCINE) IN NACL 1000-0.9 UT/500ML-% IV SOLN
INTRAVENOUS | Status: AC
  Filled 2019-02-06: qty 500

## 2019-02-06 MED ORDER — HYDRALAZINE HCL 20 MG/ML IJ SOLN: 10.0000 mg | INTRAMUSCULAR | Status: AC | PRN

## 2019-02-06 MED ORDER — SODIUM CHLORIDE 0.9 % IV SOLN
INTRAVENOUS | Status: AC
  Administered 2019-02-06: 11:00:00 via INTRAVENOUS

## 2019-02-06 MED ORDER — LABETALOL HCL 5 MG/ML IV SOLN: 10.0000 mg | INTRAVENOUS | Status: AC | PRN

## 2019-02-06 MED ORDER — FENTANYL CITRATE (PF) 100 MCG/2ML IJ SOLN
INTRAMUSCULAR | Status: AC
  Filled 2019-02-06: qty 2

## 2019-02-06 MED ORDER — HEPARIN (PORCINE) IN NACL 1000-0.9 UT/500ML-% IV SOLN
INTRAVENOUS | Status: DC | PRN
  Administered 2019-02-06: 500 mL

## 2019-02-06 MED ORDER — FENTANYL CITRATE (PF) 100 MCG/2ML IJ SOLN
INTRAMUSCULAR | Status: DC | PRN
  Administered 2019-02-06 (×2): 25 ug via INTRAVENOUS

## 2019-02-06 MED ORDER — HEPARIN SODIUM (PORCINE) 1000 UNIT/ML IJ SOLN
INTRAMUSCULAR | Status: DC | PRN
  Administered 2019-02-06: 3000 [IU] via INTRAVENOUS

## 2019-02-06 MED ORDER — ACETAMINOPHEN 325 MG PO TABS
650.0000 mg | ORAL_TABLET | ORAL | Status: DC | PRN
  Administered 2019-02-06 – 2019-02-07 (×2): 650 mg via ORAL
  Filled 2019-02-06 (×2): qty 2

## 2019-02-06 MED ORDER — HEPARIN (PORCINE) IN NACL 1000-0.9 UT/500ML-% IV SOLN
INTRAVENOUS | Status: AC
  Filled 2019-02-06: qty 1000

## 2019-02-06 MED ORDER — IOHEXOL 350 MG/ML SOLN
INTRAVENOUS | Status: DC | PRN
  Administered 2019-02-06: 65 mL via INTRA_ARTERIAL

## 2019-02-06 MED ORDER — LIDOCAINE HCL (PF) 1 % IJ SOLN
INTRAMUSCULAR | Status: AC
  Filled 2019-02-06: qty 30

## 2019-02-06 MED ORDER — LIDOCAINE HCL (PF) 1 % IJ SOLN
INTRAMUSCULAR | Status: DC | PRN
  Administered 2019-02-06: 5 mL
  Administered 2019-02-06: 1 mL

## 2019-02-06 MED ORDER — ASPIRIN 81 MG PO CHEW
81.0000 mg | CHEWABLE_TABLET | Freq: Every day | ORAL | Status: DC
  Administered 2019-02-07 – 2019-02-08 (×2): 81 mg via ORAL
  Filled 2019-02-06 (×2): qty 1

## 2019-02-06 MED ORDER — MIDAZOLAM HCL 2 MG/2ML IJ SOLN
INTRAMUSCULAR | Status: AC
  Filled 2019-02-06: qty 2

## 2019-02-06 MED ORDER — ONDANSETRON HCL 4 MG/2ML IJ SOLN: 4.0000 mg | Freq: Four times a day (QID) | INTRAMUSCULAR | Status: DC | PRN

## 2019-02-06 MED ORDER — SODIUM CHLORIDE 0.9% FLUSH
3.0000 mL | Freq: Two times a day (BID) | INTRAVENOUS | Status: DC
  Administered 2019-02-06 – 2019-02-07 (×3): 3 mL via INTRAVENOUS

## 2019-02-06 MED ORDER — SODIUM CHLORIDE 0.9% FLUSH: 3.0000 mL | INTRAVENOUS | Status: DC | PRN

## 2019-02-06 SURGICAL SUPPLY — 15 items
CATH BALLN WEDGE 5F 110CM (CATHETERS) ×2 IMPLANT
CATH INFINITI JR4 5F (CATHETERS) ×2 IMPLANT
CATH OPTITORQUE TIG 4.0 5F (CATHETERS) ×2 IMPLANT
COVER DOME SNAP 22 D (MISCELLANEOUS) ×2 IMPLANT
DEVICE RAD COMP TR BAND LRG (VASCULAR PRODUCTS) ×2 IMPLANT
GLIDESHEATH SLEND SS 6F .021 (SHEATH) ×2 IMPLANT
GUIDEWIRE .025 260CM (WIRE) ×2 IMPLANT
GUIDEWIRE INQWIRE 1.5J.035X260 (WIRE) ×1 IMPLANT
INQWIRE 1.5J .035X260CM (WIRE) ×2
KIT HEART LEFT (KITS) ×2 IMPLANT
PACK CARDIAC CATHETERIZATION (CUSTOM PROCEDURE TRAY) ×2 IMPLANT
SHEATH GLIDE SLENDER 4/5FR (SHEATH) ×2 IMPLANT
SHEATH PROBE COVER 6X72 (BAG) ×2 IMPLANT
TRANSDUCER W/STOPCOCK (MISCELLANEOUS) ×2 IMPLANT
TUBING CIL FLEX 10 FLL-RA (TUBING) ×2 IMPLANT

## 2019-02-06 NOTE — Plan of Care (Signed)
  Problem: Education: Goal: Ability to demonstrate management of disease process will improve Outcome: Progressing Goal: Ability to verbalize understanding of medication therapies will improve 02/06/2019 0504 by Marcie Mowers, RN Outcome: Progressing 02/06/2019 0238 by Marcie Mowers, RN Outcome: Progressing Goal: Individualized Educational Video(s) Outcome: Progressing   Problem: Activity: Goal: Capacity to carry out activities will improve 02/06/2019 0504 by Marcie Mowers, RN Outcome: Progressing 02/06/2019 0238 by Marcie Mowers, RN Outcome: Progressing   Problem: Cardiac: Goal: Ability to achieve and maintain adequate cardiopulmonary perfusion will improve 02/06/2019 0504 by Marcie Mowers, RN Outcome: Progressing 02/06/2019 0238 by Marcie Mowers, RN Outcome: Progressing   Problem: Education: Goal: Knowledge of General Education information will improve Description: Including pain rating scale, medication(s)/side effects and non-pharmacologic comfort measures 02/06/2019 0504 by Marcie Mowers, RN Outcome: Progressing 02/06/2019 0238 by Marcie Mowers, RN Outcome: Progressing   Problem: Health Behavior/Discharge Planning: Goal: Ability to manage health-related needs will improve Outcome: Progressing   Problem: Clinical Measurements: Goal: Ability to maintain clinical measurements within normal limits will improve 02/06/2019 0504 by Marcie Mowers, RN Outcome: Progressing 02/06/2019 0238 by Marcie Mowers, RN Outcome: Progressing Goal: Will remain free from infection 02/06/2019 0504 by Marcie Mowers, RN Outcome: Progressing 02/06/2019 0238 by Marcie Mowers, RN Outcome: Progressing Goal: Diagnostic test results will improve Outcome: Progressing Goal: Respiratory complications will improve 02/06/2019 0504 by Marcie Mowers, RN Outcome: Progressing 02/06/2019 0238 by Marcie Mowers, RN Outcome: Progressing Goal: Cardiovascular complication will be avoided 02/06/2019 0504 by Marcie Mowers, RN Outcome: Progressing 02/06/2019 0238 by Marcie Mowers, RN Outcome: Progressing   Problem: Activity: Goal: Risk for activity intolerance will decrease Outcome: Progressing   Problem: Nutrition: Goal: Adequate nutrition will be maintained Outcome: Progressing   Problem: Coping: Goal: Level of anxiety will decrease Outcome: Progressing   Problem: Elimination: Goal: Will not experience complications related to bowel motility Outcome: Progressing Goal: Will not experience complications related to urinary retention Outcome: Progressing   Problem: Pain Managment: Goal: General experience of comfort will improve Outcome: Progressing   Problem: Safety: Goal: Ability to remain free from injury will improve Outcome: Progressing   Problem: Skin Integrity: Goal: Risk for impaired skin integrity will decrease Outcome: Progressing   Problem: Education: Goal: Understanding of CV disease, CV risk reduction, and recovery process will improve Outcome: Progressing Goal: Individualized Educational Video(s) Outcome: Progressing   Problem: Activity: Goal: Ability to return to baseline activity level will improve Outcome: Progressing   Problem: Cardiovascular: Goal: Ability to achieve and maintain adequate cardiovascular perfusion will improve Outcome: Progressing Goal: Vascular access site(s) Level 0-1 will be maintained Outcome: Progressing   Problem: Health Behavior/Discharge Planning: Goal: Ability to safely manage health-related needs after discharge will improve Outcome: Progressing

## 2019-02-06 NOTE — Progress Notes (Signed)
PROGRESS NOTE    Mark ChessmanJeffrey Pittman  NFA:[id number]:[id number] DOB: 10/13/1965 DOA: 02/04/2019 PCP: Patient, No Pcp Per    Brief Narrative:  53 year old gentleman with history of coronary artery disease, hypertension, congestive heart failure with ejection fraction of 40%, type 2 diabetes on insulin who was admitted to [facility] 2 days ago with about 2 months history of shortness of breath, chest pain, leg swelling in the context of noncompliance to medication and follow-ups.  He was admitted at [facility], had abnormal Lexiscan, also found to have low-grade fever with right-sided loculated pleural effusion, CHF with EF of 35% with recently known normal ejection fraction, mildly elevated troponins.  A CTA of the chest also showed right pleural effusion, left lower lobe tumor but no PE.  For his cardiac and pulmonary work-up, he was transferred to [facility].   Assessment & Plan:   Active Problems:   Parapneumonic effusion   Empyema (HCC)   Protein-calorie malnutrition, severe   Shortness of breath   Coronary artery disease   Ischemic cardiomyopathy  Pneumonia, with complicated right empyema: Status post chest tube placement, empyema, currently freely flowing.  2.5 L drained overnight.  Good expansion of the lungs on clinical exam.  Repeat CT scan today pending.  Previously treated with vancomycin and cefepime.  Cultures negative.  MRSA negative.  Currently on Unasyn.   Continue aggressive chest physiotherapy.  Oxygen to keep saturation more than 90%.  Pleural fluid cultures are pending.    New onset congestive heart failure, abnormal stress test: Currently chest pain-free.  Patient remains on aspirin, ACE inhibitor's beta-blockers and statin.  Followed by cardiology.  Underwent cardiac cath today, found to have triple-vessel disease.  Medical management and follow-up.  Starting on aspirin.  He is tolerating Lasix well. Added imdur today.  Lung nodules: smoker, 2 months of weight  loss and low appetite.  No sputum production. Patient has CT chest done today. Followed by pulmonary.   Type 2 diabetes: Fairly controlled.  A1C 8.7. On insulin.  Smoker: Counseled to quit.  He is motivated.  Hypertension: Blood pressures stable.  He is on lisinopril, nitrates now.    DVT prophylaxis: heparin sq Code Status: Full code Family Communication: None Disposition Plan: Home after hospitalization   Consultants:   Cardiology  PCCM  Procedures:   Right chest Ct tube placement 8/3  Cardiac cath 8/4    Antimicrobials:   Cefepime, 02/04/2019  Vancomycin, 02/02/2019-02/03/2019  Unasyn 02/05/2019- ---    Subjective: Patient seen and examined.  Denied any overnight events.  He feels breathing better after fluid came out.    Objective: Vitals:   02/06/19 1000 02/06/19 1004 02/06/19 1009 02/06/19 1045  BP: 126/83 124/81 121/82 125/88  Pulse: 78 77 (!) 103   Resp: 17 13 (!) 8   Temp:    97.9 F (36.6 C)  TempSrc:    Oral  SpO2: 98% 99% 99%   Weight:      Height:        Intake/Output Summary (Last 24 hours) at 02/06/2019 1257 Last data filed at 02/06/2019 1230 Gross per 24 hour  Intake 524.37 ml  Output 2869 ml  Net -2344.63 ml   Filed Weights   02/04/19 2013  Weight: 54.8 kg    Examination:  General exam: Appears calm and comfortable, chronically sick looking and thinly built.  On minimum oxygen with good saturations. Respiratory system: Clear to auscultation. Respiratory effort normal.  Right side actively draining CT tube with thin pus.  Cardiovascular system: S1 & S2 heard, RRR. No JVD, murmurs, rubs, gallops or clicks. No pedal edema. Gastrointestinal system: Abdomen is nondistended, soft and nontender. No organomegaly or masses felt. Normal bowel sounds heard. Central nervous system: Alert and oriented. No focal neurological deficits. Extremities: Symmetric 5 x 5 power. Skin: No rashes, lesions or ulcers Psychiatry: Judgement and insight appear  normal. Mood & affect appropriate.     Data Reviewed: I have personally reviewed following labs and imaging studies  CBC: Recent Labs  Lab 02/04/19 2119 02/05/19 0228 02/06/19 0437  WBC 18.2* 17.1* 10.9*  NEUTROABS 15.1*  --  8.3*  HGB 13.8 13.6 13.5  HCT 41.3 41.0 40.4  MCV 94.1 94.3 94.6  PLT 394 390 338   Basic Metabolic Panel: Recent Labs  Lab 02/04/19 2119 02/05/19 0228 02/06/19 0437  NA 131* 135 134*  K 3.8 3.8 4.2  CL 92* 97* 97*  CO2 25 29 26   GLUCOSE 227* 223* 273*  BUN 31* 32* 42*  CREATININE 0.90 1.03 1.18  CALCIUM 8.3* 8.5* 8.4*  MG 2.2  --   --   PHOS 3.7  --   --    GFR: Estimated Creatinine Clearance: 56.1 mL/min (by C-G formula based on SCr of 1.18 mg/dL). Liver Function Tests: Recent Labs  Lab 02/04/19 2119  AST 19  ALT 22  ALKPHOS 66  BILITOT 0.3  PROT 5.8*  ALBUMIN 2.0*   No results for input(s): LIPASE, AMYLASE in the last 168 hours. No results for input(s): AMMONIA in the last 168 hours. Coagulation Profile: Recent Labs  Lab 02/04/19 2119  INR 1.2   Cardiac Enzymes: No results for input(s): CKTOTAL, CKMB, CKMBINDEX, TROPONINI in the last 168 hours. BNP (last 3 results) No results for input(s): PROBNP in the last 8760 hours. HbA1C: Recent Labs    02/04/19 2119  HGBA1C 8.7*   CBG: Recent Labs  Lab 02/05/19 1043 02/05/19 1600 02/05/19 2120 02/06/19 0619 02/06/19 1115  GLUCAP 172* 372* 388* 251* 163*   Lipid Profile: No results for input(s): CHOL, HDL, LDLCALC, TRIG, CHOLHDL, LDLDIRECT in the last 72 hours. Thyroid Function Tests: Recent Labs    02/04/19 2119  TSH 1.910   Anemia Panel: No results for input(s): VITAMINB12, FOLATE, FERRITIN, TIBC, IRON, RETICCTPCT in the last 72 hours. Sepsis Labs: No results for input(s): PROCALCITON, LATICACIDVEN in the last 168 hours.  Recent Results (from the past 240 hour(s))  MRSA PCR Screening     Status: None   Collection Time: 02/04/19  6:09 PM   Specimen:  Nasopharyngeal  Result Value Ref Range Status   MRSA by PCR NEGATIVE NEGATIVE Final    Comment:        The GeneXpert MRSA Assay (FDA approved for NASAL specimens only), is one component of a comprehensive MRSA colonization surveillance program. It is not intended to diagnose MRSA infection nor to guide or monitor treatment for MRSA infections. Performed at [facility], [address] [place], [place] [address]   SARS Coronavirus 2 [facility](Hospital order, Performed in [facility] lab)     Status: None   Collection Time: 02/05/19  9:07 AM  Result Value Ref Range Status   SARS Coronavirus 2 NEGATIVE NEGATIVE Final    Comment: (NOTE) If result is NEGATIVE SARS-CoV-2 target nucleic acids are NOT DETECTED. The SARS-CoV-2 RNA is generally detectable in upper and lower  respiratory specimens during the acute phase of infection. The lowest  concentration of SARS-CoV-2 viral copies this assay can  detect is 250  copies / mL. A negative result does not preclude SARS-CoV-2 infection  and should not be used as the sole basis for treatment or other  patient management decisions.  A negative result may occur with  improper specimen collection / handling, submission of specimen other  than nasopharyngeal swab, presence of viral mutation(s) within the  areas targeted by this assay, and inadequate number of viral copies  (<250 copies / mL). A negative result must be combined with clinical  observations, patient history, and epidemiological information. If result is POSITIVE SARS-CoV-2 target nucleic acids are DETECTED. The SARS-CoV-2 RNA is generally detectable in upper and lower  respiratory specimens dur ing the acute phase of infection.  Positive  results are indicative of active infection with SARS-CoV-2.  Clinical  correlation with patient history and other diagnostic information is  necessary to determine patient infection status.  Positive results do  not rule out bacterial  infection or co-infection with other viruses. If result is PRESUMPTIVE POSTIVE SARS-CoV-2 nucleic acids MAY BE PRESENT.   A presumptive positive result was obtained on the submitted specimen  and confirmed on repeat testing.  While 2019 novel coronavirus  (SARS-CoV-2) nucleic acids may be present in the submitted sample  additional confirmatory testing may be necessary for epidemiological  and / or clinical management purposes  to differentiate between  SARS-CoV-2 and other Sarbecovirus currently known to infect humans.  If clinically indicated additional testing with an alternate test  methodology [phone]) is advised. The SARS-CoV-2 RNA is generally  detectable in upper and lower respiratory sp ecimens during the acute  phase of infection. The expected result is Negative. Fact Sheet for Patients:  BoilerBrush.com.cy Fact Sheet for Healthcare Providers: https://pope.com/ This test is not yet approved or cleared by the [place] FDA and has been authorized for detection and/or diagnosis of SARS-CoV-2 by FDA under an Emergency Use Authorization (EUA).  This EUA will remain in effect (meaning this test can be used) for the duration of the COVID-19 declaration under Section 564(b)(1) of the Act, 21 U.S.C. section 360bbb-3(b)(1), unless the authorization is terminated or revoked sooner. Performed at [facility], [address], [place], [place] [address]   Culture, body fluid-bottle     Status: None (Preliminary result)   Collection Time: 02/05/19 11:45 AM   Specimen: Pleura  Result Value Ref Range Status   Specimen Description PLEURAL LEFT  Final   Special Requests NONE  Final   Culture   Final    NO GROWTH < 24 HOURS Performed at [facility], [address], [place], [place] [address]    Report Status PENDING  Incomplete  Gram stain     Status: None   Collection Time: 02/05/19 11:45 AM   Specimen: Pleura  Result  Value Ref Range Status   Specimen Description PLEURAL LEFT  Final   Special Requests NONE  Final   Gram Stain   Final    MODERATE WBC PRESENT, PREDOMINANTLY PMN NO ORGANISMS SEEN Performed at [facility], [address], [place], [place] [address]    Report Status 02/05/2019 FINAL  Final         Radiology Studies: Dg Chest 1 View  Result Date: 02/05/2019 CLINICAL DATA:  Parapneumonic effusion. Right chest tube in place. EXAM: CHEST  1 VIEW COMPARISON:  Chest x-ray dated 02/02/2019 and chest CT dated 02/04/2019 FINDINGS: Right chest tube in place. No pneumothorax. Improved aeration at the right lung base with  some residual atelectasis and lung haziness, probably due to re-expansion edema. Chronic cardiomegaly. Pulmonary vascularity is normal. Left lung is clear. Left effusion noted on the prior CT scan is not apparent on this chest x-ray. IMPRESSION: 1. No pneumothorax.  Right chest tube in place. 2. Improved aeration at the right lung base. Electronically Signed   By: Lorriane Shire M.D.   On: 02/05/2019 12:23   Dg Chest Port 1 View  Result Date: 02/06/2019 CLINICAL DATA:  53 year old male status post chest tube placement yesterday for empyema. EXAM: PORTABLE CHEST 1 VIEW COMPARISON:  02/05/2019 chest radiographs, and earlier including [facility] chest CT 02/04/2019. FINDINGS: Portable AP upright view at 0625 hours. Stable right chest tube located at the right lung base. No pneumothorax. Veiling opacity in the right mid and lower lung compatible with residual fluid, which might be within the fissure. Additional patchy atelectasis or scarring at the right lung base. Underlying cardiomegaly. Stable cardiac size and mediastinal contours. Stable left lung. Visualized tracheal air column is within normal limits. Negative visible bowel gas pattern. No acute osseous abnormality identified. IMPRESSION: 1. Stable right chest tube. No pneumothorax. 2. Residual right pleural effusion, some may  be loculated and/or within the fissure. 3. Cardiomegaly. Electronically Signed   By: Genevie Ann M.D.   On: 02/06/2019 08:06        Scheduled Meds:  [START ON 02/07/2019] aspirin  81 mg Oral Daily   atorvastatin  40 mg Oral q1800   budesonide (PULMICORT) nebulizer solution  0.25 mg Nebulization BID   famotidine  20 mg Oral QHS   feeding supplement (ENSURE ENLIVE)  237 mL Oral BID BM   heparin  5,000 Units Subcutaneous Q8H   insulin aspart  0-5 Units Subcutaneous QHS   insulin aspart  0-9 Units Subcutaneous TID WC   ipratropium-albuterol  3 mL Nebulization Q6H   isosorbide mononitrate  30 mg Oral Daily   ketorolac  30 mg Intravenous Q6H   lisinopril  20 mg Oral Daily   metoprolol succinate  100 mg Oral Daily   multivitamin with minerals  1 tablet Oral Daily   sodium chloride flush  3 mL Intravenous Q12H   sodium chloride flush  3 mL Intravenous Q12H   Continuous Infusions:  sodium chloride 60 mL/hr at 02/06/19 1051   sodium chloride     ampicillin-sulbactam (UNASYN) IV 3 g (02/06/19 0617)     LOS: 2 days    Time spent: 25 minutes    Barb Merino, MD [facility] Pager [phone]  If 7PM-7AM, please contact night-coverage [email or web address] Password [facility] 02/06/2019, 12:57 PM

## 2019-02-06 NOTE — Progress Notes (Addendum)
Inpatient Diabetes Program Recommendations  AACE/ADA: New Consensus Statement on Inpatient Glycemic Control (2015)  Target Ranges:  Prepandial:   less than 140 mg/dL      Peak postprandial:   less than 180 mg/dL (1-2 hours)      Critically ill patients:  140 - 180 mg/dL   Results for Mark Pittman, Mark Pittman (MRN [id number]) as of 02/06/2019 08:34  Ref. Range 02/05/2019 06:31 02/05/2019 10:43 02/05/2019 16:00 02/05/2019 21:20 02/06/2019 06:19  Glucose-Capillary Latest Ref Range: 70 - 99 mg/dL 201 (H) 172 (H) 372 (H) 388 (H) 251 (H)   Review of Glycemic Control  Diabetes history: DM 2 Outpatient Diabetes medications: Metformin BID listed in med rec Current orders for Inpatient glycemic control: Novolog 0-9 units tid, Novolog 0-5 units qhs  A1c 8.7% on 8/2  Inpatient Diabetes Program Recommendations:    Increase Novolog Correction scale to Moderate 0-15 units tid.  Noted NPO  Glucose increases into the 300's after meal intake consider Novolog 3 units tid meal coverage in addition to correction scale when patient is eating at least 50% of meals.  Thanks,  Tama Headings RN, MSN, BC-ADM Inpatient Diabetes Coordinator Team Pager [phone] (8a-5p)

## 2019-02-06 NOTE — Progress Notes (Signed)
NAME:  Mark Pittman, MRN:  [id number], DOB:  08/07/1965, LOS: 2 ADMISSION DATE:  02/04/2019, CONSULTATION DATE:  02/05/19 REFERRING MD:  Sloan Leiter, CHIEF COMPLAINT:  SOB   Brief History   53 year old man presenting with constitutional symptoms found to have large complex R pleural effusion and possible NSTEMI.    Past Medical History  CAD Prior MI Smoker DM  Significant Hospital Events   8/2 admitted 8/3 chest tube, also TPA and DNase infused later that evening. 8/4 approximately 2500 mL of purulent fluid drained from right pleural space since placement of chest tube.  Underwent left heart cath   Consults:  PCCM, Cardiology  Procedures:  8/3 chest tube 8/4: Left heart cath>>>  Significant Diagnostic Tests:  CT personally reviewed: no PE, small left effusion, dilated atria, large complex R pleural effusion.  There are a couple GG nodules on L and maybe and enlarged 7 node.  Micro Data:  Body fluid 02/05/19>>  Antimicrobials:  Unasyn 8/3>>  augmentin 8/4>>> Interim history/subjective:  Admitted  Objective   Blood pressure 121/82, pulse (Abnormal) 103, temperature 97.6 F (36.4 C), temperature source Oral, resp. rate (Abnormal) 8, height 5\' 6"  (1.676 m), weight 54.8 kg, SpO2 99 %.        Intake/Output Summary (Last 24 hours) at 02/06/2019 1038 Last data filed at 02/06/2019 5093 Gross per 24 hour  Intake 644.37 ml  Output 2669 ml  Net -2024.63 ml   Filed Weights   02/04/19 2013  Weight: 54.8 kg    Examination: General this is a frail 53 year old white male he is currently resting in bed status post cardiac catheterization he is in no acute distress HEENT normocephalic atraumatic no jugular venous distention mucous membranes are moist Pulmonary: Diminished on the right no wheezing, right chest tube drainage with an additional 200 mL overnight no air leak appreciated Cardiac: Regular rate and rhythm Abdomen: Soft nontender Extremities: Warm and dry Neuro: Awake  oriented.  Resolved Hospital Problem list   NA  Assessment & Plan:  # R empyema- in setting of poorly controlled DM; likely cause of chest pains and B symptoms # L lung nodules- hopefully reactive # Unintentional weight loss- suspicion related to ongoing infection more than anything else # New CHF, with newly identified four-vessel coronary artery disease by left heart cath  # Smoking- knows he needs to stop # DM- A1c 8.7% # Chronic benzo use  Discussion Right chest tube placed on 8/3 2-1/2 L of output thus far consistent with empyema.  He got a dose of TPA and Pulmozyme last evening to assist with loculated component of his effusion.  Follow-up chest x-ray imaging with ongoing right-sided airspace disease difficult to tell how much of this is pleural thickening versus residual effusion  Plan/recommendation Continue chest tube to suction We will obtain a noncontrasted CT of chest to further evaluate pleural space, at that point we will decide on either repeating TPA and DNase, versus cardiothoracic consult versus discontinue chest tube all depending on residual pleural imaging Continue antibiotics, I think we can change to Augmentin, he will need at least 2 weeks of therapy Further recommendations to be determined pending CT imaging Deferring further cardiac evaluation and therapy per cardiology team   Best practice:  Diet: cardiac/diabetic Pain/Anxiety/Delirium protocol (if indicated): NA VAP protocol (if indicated): NA DVT prophylaxis: heparin GI prophylaxis: start pepcid Glucose control: per primary Mobility: up as able Code Status: full Family Communication: per primary Disposition: PCU  Erick Colace ACNP-BC [employer]  Pulmonary/Critical Care Pager # [phone] OR # [phone] if no answer

## 2019-02-06 NOTE — Interval H&P Note (Signed)
Cath Lab Visit (complete for each Cath Lab visit)  Clinical Evaluation Leading to the Procedure:   ACS: No.  Non-ACS:    Anginal Classification: CCS II  Anti-ischemic medical therapy: Minimal Therapy (1 class of medications)  Non-Invasive Test Results: No non-invasive testing performed  Prior CABG: No previous CABG      History and Physical Interval Note:  02/06/2019 9:16 AM  Alison Stalling  has presented today for surgery, with the diagnosis of n stemi.  The various methods of treatment have been discussed with the patient and family. After consideration of risks, benefits and other options for treatment, the patient has consented to  Procedure(s): RIGHT/LEFT HEART CATH AND CORONARY ANGIOGRAPHY (N/A) as a surgical intervention.  The patient's history has been reviewed, patient examined, no change in status, stable for surgery.  I have reviewed the patient's chart and labs.  Questions were answered to the patient's satisfaction.     Shelva Majestic

## 2019-02-06 NOTE — Plan of Care (Signed)
  Problem: Education: Goal: Ability to demonstrate management of disease process will improve Outcome: Progressing Goal: Ability to verbalize understanding of medication therapies will improve Outcome: Progressing Goal: Individualized Educational Video(s) Outcome: Progressing   Problem: Activity: Goal: Capacity to carry out activities will improve Outcome: Progressing   Problem: Cardiac: Goal: Ability to achieve and maintain adequate cardiopulmonary perfusion will improve Outcome: Progressing   Problem: Education: Goal: Knowledge of General Education information will improve Description: Including pain rating scale, medication(s)/side effects and non-pharmacologic comfort measures Outcome: Progressing   Problem: Health Behavior/Discharge Planning: Goal: Ability to manage health-related needs will improve Outcome: Progressing   Problem: Clinical Measurements: Goal: Ability to maintain clinical measurements within normal limits will improve Outcome: Progressing Goal: Will remain free from infection Outcome: Progressing Goal: Diagnostic test results will improve Outcome: Progressing Goal: Respiratory complications will improve Outcome: Progressing Goal: Cardiovascular complication will be avoided Outcome: Progressing   Problem: Activity: Goal: Risk for activity intolerance will decrease Outcome: Progressing   Problem: Nutrition: Goal: Adequate nutrition will be maintained Outcome: Progressing   Problem: Coping: Goal: Level of anxiety will decrease Outcome: Progressing   Problem: Elimination: Goal: Will not experience complications related to bowel motility Outcome: Progressing Goal: Will not experience complications related to urinary retention Outcome: Progressing   Problem: Pain Managment: Goal: General experience of comfort will improve Outcome: Progressing   Problem: Safety: Goal: Ability to remain free from injury will improve Outcome: Progressing    Problem: Skin Integrity: Goal: Risk for impaired skin integrity will decrease Outcome: Progressing   Problem: Education: Goal: Understanding of CV disease, CV risk reduction, and recovery process will improve Outcome: Progressing Goal: Individualized Educational Video(s) Outcome: Progressing   Problem: Activity: Goal: Ability to return to baseline activity level will improve Outcome: Progressing   Problem: Cardiovascular: Goal: Ability to achieve and maintain adequate cardiovascular perfusion will improve Outcome: Progressing Goal: Vascular access site(s) Level 0-1 will be maintained Outcome: Progressing   Problem: Health Behavior/Discharge Planning: Goal: Ability to safely manage health-related needs after discharge will improve Outcome: Progressing   

## 2019-02-07 ENCOUNTER — Encounter (HOSPITAL_COMMUNITY): Payer: Self-pay

## 2019-02-07 ENCOUNTER — Telehealth: Payer: Self-pay

## 2019-02-07 DIAGNOSIS — I255 Ischemic cardiomyopathy: Secondary | ICD-10-CM

## 2019-02-07 DIAGNOSIS — Z9689 Presence of other specified functional implants: Secondary | ICD-10-CM

## 2019-02-07 DIAGNOSIS — I25119 Atherosclerotic heart disease of native coronary artery with unspecified angina pectoris: Secondary | ICD-10-CM

## 2019-02-07 DIAGNOSIS — I25118 Atherosclerotic heart disease of native coronary artery with other forms of angina pectoris: Secondary | ICD-10-CM

## 2019-02-07 LAB — GLUCOSE, CAPILLARY
Glucose-Capillary: 311 mg/dL — ABNORMAL HIGH (ref 70–99)
Glucose-Capillary: 315 mg/dL — ABNORMAL HIGH (ref 70–99)
Glucose-Capillary: 214 mg/dL — ABNORMAL HIGH (ref 70–99)
Glucose-Capillary: 257 mg/dL — ABNORMAL HIGH (ref 70–99)

## 2019-02-07 LAB — BASIC METABOLIC PANEL
Anion gap: 11 (ref 5–15)
BUN: 35 mg/dL — ABNORMAL HIGH (ref 6–20)
CO2: 29 mmol/L (ref 22–32)
Calcium: 8.4 mg/dL — ABNORMAL LOW (ref 8.9–10.3)
Chloride: 96 mmol/L — ABNORMAL LOW (ref 98–111)
Creatinine, Ser: 1.03 mg/dL (ref 0.61–1.24)
GFR calc Af Amer: >60 mL/min (ref >60)
GFR calc non Af Amer: >60 mL/min (ref >60)
Glucose, Bld: 300 mg/dL — ABNORMAL HIGH (ref 70–99)
Potassium: 4.6 mmol/L (ref 3.5–5.1)
Sodium: 136 mmol/L (ref 135–145)

## 2019-02-07 LAB — CBC
HCT: 39.6 % (ref 39.0–52.0)
Hemoglobin: 12.7 g/dL — ABNORMAL LOW (ref 13.0–17.0)
MCH: 31.5 pg (ref 26.0–34.0)
MCHC: 32.1 g/dL (ref 30.0–36.0)
MCV: 98.3 fL (ref 80.0–100.0)
Platelets: 343 10*3/uL (ref 150–400)
RBC: 4.03 MIL/uL — ABNORMAL LOW (ref 4.22–5.81)
RDW: 13.6 % (ref 11.5–15.5)
WBC: 9.7 10*3/uL (ref 4.0–10.5)
nRBC: 0 % (ref 0.0–0.2)

## 2019-02-07 LAB — PH, BODY FLUID: pH, Body Fluid: 6.9

## 2019-02-07 MED ORDER — INSULIN DETEMIR 100 UNIT/ML ~~LOC~~ SOLN
10.0000 [IU] | Freq: Every day | SUBCUTANEOUS | Status: DC
  Administered 2019-02-07: 10 [IU] via SUBCUTANEOUS
  Filled 2019-02-07 (×2): qty 0.1

## 2019-02-07 MED ORDER — AMOXICILLIN-POT CLAVULANATE 875-125 MG PO TABS
1.0000 | ORAL_TABLET | Freq: Two times a day (BID) | ORAL | Status: DC
  Administered 2019-02-07 – 2019-02-08 (×3): 1 via ORAL
  Filled 2019-02-07 (×3): qty 1

## 2019-02-07 MED ORDER — INSULIN ASPART 100 UNIT/ML ~~LOC~~ SOLN
4.0000 [IU] | Freq: Three times a day (TID) | SUBCUTANEOUS | Status: DC
  Administered 2019-02-07 – 2019-02-08 (×2): 4 [IU] via SUBCUTANEOUS

## 2019-02-07 MED ORDER — IPRATROPIUM-ALBUTEROL 0.5-2.5 (3) MG/3ML IN SOLN
3.0000 mL | Freq: Three times a day (TID) | RESPIRATORY_TRACT | Status: DC
  Administered 2019-02-07 – 2019-02-08 (×4): 3 mL via RESPIRATORY_TRACT
  Filled 2019-02-07 (×4): qty 3

## 2019-02-07 NOTE — Progress Notes (Signed)
Progress Note  Patient Name: Mark Pittman Date of Encounter: 02/07/2019  Primary Cardiologist: No primary care provider on file. Dr. Tomie China  Subjective   Feels better.  Chest pain improved.  Inpatient Medications    Scheduled Meds: . amoxicillin-clavulanate  1 tablet Oral Q12H  . aspirin  81 mg Oral Daily  . atorvastatin  40 mg Oral q1800  . budesonide (PULMICORT) nebulizer solution  0.25 mg Nebulization BID  . famotidine  20 mg Oral QHS  . feeding supplement (ENSURE ENLIVE)  237 mL Oral BID BM  . heparin  5,000 Units Subcutaneous Q8H  . insulin aspart  0-5 Units Subcutaneous QHS  . insulin aspart  0-9 Units Subcutaneous TID WC  . ipratropium-albuterol  3 mL Nebulization TID  . isosorbide mononitrate  30 mg Oral Daily  . lisinopril  20 mg Oral Daily  . metoprolol succinate  100 mg Oral Daily  . multivitamin with minerals  1 tablet Oral Daily  . sodium chloride flush  3 mL Intravenous Q12H  . sodium chloride flush  3 mL Intravenous Q12H   Continuous Infusions: . sodium chloride     PRN Meds: sodium chloride, acetaminophen, clonazePAM, diazepam, fentaNYL (SUBLIMAZE) injection, ipratropium-albuterol, ondansetron (ZOFRAN) IV, oxyCODONE-acetaminophen, sodium chloride flush   Vital Signs    Vitals:   02/07/19 0423 02/07/19 0824 02/07/19 0827 02/07/19 1028  BP:      Pulse:      Resp:      Temp: 97.6 F (36.4 C)   97.6 F (36.4 C)  TempSrc: Oral   Oral  SpO2:  99% 99%   Weight:      Height:        Intake/Output Summary (Last 24 hours) at 02/07/2019 1048 Last data filed at 02/07/2019 0600 Gross per 24 hour  Intake 240 ml  Output 880 ml  Net -640 ml   Last 3 Weights 02/04/2019  Weight (lbs) 120 lb 13 oz  Weight (kg) 54.8 kg      Telemetry    NSR - Personally Reviewed  ECG      Physical Exam   GEN: No acute distress.   Neck: No JVD Cardiac: RRR, no murmurs, rubs, or gallops.  Respiratory: Clear to auscultation bilaterally. GI: Soft, nontender,  non-distended  MS: No edema; No deformity. Riht radial site intact. 2+ pulse. No hematoma Neuro:  Nonfocal  Psych: Normal affect   Labs    High Sensitivity Troponin:  No results for input(s): TROPONINIHS in the last 720 hours.    Cardiac EnzymesNo results for input(s): TROPONINI in the last 168 hours. No results for input(s): TROPIPOC in the last 168 hours.   Chemistry Recent Labs  Lab 02/04/19 2119 02/05/19 0228 02/06/19 0437 02/06/19 0940 02/06/19 0950 02/07/19 0215  NA 131* 135 134* 138  137 138 136  K 3.8 3.8 4.2 3.8  3.9 3.8 4.6  CL 92* 97* 97*  --   --  96*  CO2 25 29 26   --   --  29  GLUCOSE 227* 223* 273*  --   --  300*  BUN 31* 32* 42*  --   --  35*  CREATININE 0.90 1.03 1.18  --   --  1.03  CALCIUM 8.3* 8.5* 8.4*  --   --  8.4*  PROT 5.8*  --   --   --   --   --   ALBUMIN 2.0*  --   --   --   --   --  AST 19  --   --   --   --   --   ALT 22  --   --   --   --   --   ALKPHOS 66  --   --   --   --   --   BILITOT 0.3  --   --   --   --   --   GFRNONAA >60 >60 >60  --   --  >60  GFRAA >60 >60 >60  --   --  >60  ANIONGAP 14 9 11   --   --  11     Hematology Recent Labs  Lab 02/05/19 0228 02/06/19 0437 02/06/19 0940 02/06/19 0950 02/07/19 0215  WBC 17.1* 10.9*  --   --  9.7  RBC 4.35 4.27  --   --  4.03*  HGB 13.6 13.5 13.3  13.3 12.9* 12.7*  HCT 41.0 40.4 39.0  39.0 38.0* 39.6  MCV 94.3 94.6  --   --  98.3  MCH 31.3 31.6  --   --  31.5  MCHC 33.2 33.4  --   --  32.1  RDW 13.6 13.7  --   --  13.6  PLT 390 338  --   --  343    BNP Recent Labs  Lab 02/04/19 2119  BNP 1,550.4*     DDimer No results for input(s): DDIMER in the last 168 hours.   Radiology    Dg Chest 1 View  Result Date: 02/05/2019 CLINICAL DATA:  Parapneumonic effusion. Right chest tube in place. EXAM: CHEST  1 VIEW COMPARISON:  Chest x-ray dated 02/02/2019 and chest CT dated 02/04/2019 FINDINGS: Right chest tube in place. No pneumothorax. Improved aeration at the right lung  base with some residual atelectasis and lung haziness, probably due to re-expansion edema. Chronic cardiomegaly. Pulmonary vascularity is normal. Left lung is clear. Left effusion noted on the prior CT scan is not apparent on this chest x-ray. IMPRESSION: 1. No pneumothorax.  Right chest tube in place. 2. Improved aeration at the right lung base. Electronically Signed   By: Francene BoyersJames  Maxwell M.D.   On: 02/05/2019 12:23   Ct Chest Wo Contrast  Result Date: 02/06/2019 CLINICAL DATA:  Chest tube placement. EXAM: CT CHEST WITHOUT CONTRAST TECHNIQUE: Multidetector CT imaging of the chest was performed following the standard protocol without IV contrast. COMPARISON:  CT chest 02/04/2019 FINDINGS: Cardiovascular: The heart is within normal limits in size and stable. No pericardial effusion. There is tortuosity and calcification of the thoracic aorta and age advanced fairly extensive coronary artery calcifications. Mediastinum/Nodes: Persistent borderline enlarged mediastinal and hilar lymph nodes. Lungs/Pleura: Interval placement of a right-sided chest tube. Significant reduction of pleural effusion seen on the prior chest CT. Some residual loculated. Fluid collection in the right upper lobe. Small amount of residual pleural air. 12 mm superior segment left lower lobe pulmonary nodule on image number 47 of series 4. There is also a small cavitary lesion in the left lower lobe on image number 86 which measures 8 mm. Moderate nodularity along the right major fissure without discrete lesion. I do not see any obvious right lung mass or worrisome right lung nodules. Patchy areas of subpleural atelectasis. Small left pleural effusion with overlying atelectasis. Upper Abdomen: No significant upper abdominal findings. Advanced vascular calcifications. No significant bony findings. Musculoskeletal: No chest wall mass, supraclavicular or axillary adenopathy. Body wall edema is noted. No significant bony findings. IMPRESSION: 1.  Right-sided  chest tube in place with near complete evacuation of the large pleural fluid collection. Areas of right lower lobe atelectasis persist. 2. Small left pleural effusion with overlying atelectasis. 3. 2 left lower lobe pulmonary lesions as detailed above. PET-CT suggested for further evaluation. 4. Stable borderline enlarged mediastinal and hilar lymph nodes. 5. Diffuse body wall edema and upper abdominal mesenteric edema. 6. Stable age advanced three-vessel coronary artery calcifications and aortic calcifications. Aortic Atherosclerosis (ICD10-I70.0). Electronically Signed   By: Marijo Sanes M.D.   On: 02/06/2019 13:19   Dg Chest Port 1 View  Result Date: 02/06/2019 CLINICAL DATA:  54 year old male status post chest tube placement yesterday for empyema. EXAM: PORTABLE CHEST 1 VIEW COMPARISON:  02/05/2019 chest radiographs, and earlier including [facility] chest CT 02/04/2019. FINDINGS: Portable AP upright view at 0625 hours. Stable right chest tube located at the right lung base. No pneumothorax. Veiling opacity in the right mid and lower lung compatible with residual fluid, which might be within the fissure. Additional patchy atelectasis or scarring at the right lung base. Underlying cardiomegaly. Stable cardiac size and mediastinal contours. Stable left lung. Visualized tracheal air column is within normal limits. Negative visible bowel gas pattern. No acute osseous abnormality identified. IMPRESSION: 1. Stable right chest tube. No pneumothorax. 2. Residual right pleural effusion, some may be loculated and/or within the fissure. 3. Cardiomegaly. Electronically Signed   By: Genevie Ann M.D.   On: 02/06/2019 08:06    Cardiac Studies     Patient Profile     53 y.o. male with empyema and CAD  Assessment & Plan    1) Chest tube to be removed today.  ABx for 2 weks.  2) CAD: Medical management for now.  COuld consider PCI after he has recovered form this lung infection.  Explained to the  patient.  HE is in agreement. LAD disease and subtotalled circ lesion, near CTO, with left to left collaterals.  Anginal sx seem to be controlled at this time.  No CHF sx currently despite low EF.       For questions or updates, please contact [place] Please consult [email or web address] for contact info under        Signed, Larae Grooms, MD  02/07/2019, 10:48 AM

## 2019-02-07 NOTE — Progress Notes (Signed)
PROGRESS NOTE    Mark ChessmanJeffrey Cardiff  ZOX:[id number] DOB: 10/13/1965 DOA: 02/04/2019 PCP: Patient, No Pcp Per    Brief Narrative:  53 year old gentleman with history of coronary artery disease, hypertension, congestive heart failure with ejection fraction of 40%, type 2 diabetes on insulin who was [facility] to [facility] days ago with about 2 months history of shortness of breath, chest pain, leg swelling in the context of noncompliance to medication and follow-ups.  He was [facility] at [facility], had abnormal Lexiscan, also found to have low-grade fever with right-sided loculated pleural effusion, CHF with EF of 35% with recently known normal ejection fraction, mildly elevated troponins.  A CTA of the chest also showed right pleural effusion, left lower lobe tumor but no PE.  For his cardiac and pulmonary work-up, [facility] was [facility].   Assessment & Plan:   Active Problems:   Parapneumonic effusion   Empyema (HCC)   Protein-calorie malnutrition, severe   Shortness of breath   Coronary artery disease   Ischemic cardiomyopathy  Pneumonia, with complicated right empyema: Status post chest tube placement, empyema, almost completely drained.  Followed by pulmonology.  Good expansion of the lungs.  Removing chest tube today.  Previously treated with vancomycin and cefepime.  Cultures negative.  MRSA negative.  Currently on Unasyn.   Continue aggressive chest physiotherapy.  Oxygen to keep saturation more than 90%.  Pleural fluid cultures are pending and negative so far. Advance activities.  Ambulate in the hallway after chest tube removal. After adequate clinical improvement, will discharge on 2 weeks of oral antibiotics with Augmentin.  New onset congestive heart failure, abnormal stress test: Currently chest pain-free.  Patient remains on aspirin, ACE inhibitor's beta-blockers and statin.  Followed by cardiology. Underwent cardiac cath and found to have  triple-vessel disease.  Medical management and follow-up.  Starting on aspirin.  He is tolerating Lasix well.  Added nitrates and tolerating.  Lung nodules: smoker, 2 months of weight loss and low appetite.  No sputum production.  Left lower lobe nodule seen on CT scan.  Smoker.  Pulmonology to schedule follow-up outpatient.  Type 2 diabetes: Fairly controlled.  A1C 8.7.  Patient on metformin at home.  Will add insulin in the hospital.  On discharge, will add another oral hypoglycemics.  Smoker: Counseled to quit.  He is motivated.  Hypertension: Blood pressures stable.  He is on lisinopril, nitrates now.    DVT prophylaxis: heparin sq Code Status: Full code Family Communication: None Disposition Plan: Home after hospitalization.  Anticipate tomorrow if improves adequately.   Consultants:   Cardiology  PCCM  Procedures:   Right chest Ct tube placement 8/3-removed on 02/07/2019  Cardiac cath 8/4    Antimicrobials:   Cefepime, 02/04/2019  Vancomycin, 02/02/2019-02/03/2019  Unasyn 02/05/2019- ---    Subjective: Seen and examined.  No overnight events.  On minimal oxygen saturating 99%.  No chest pain or shortness of breath today.  Objective: Vitals:   02/07/19 0423 02/07/19 0824 02/07/19 0827 02/07/19 1028  BP:      Pulse:      Resp:      Temp: 97.6 F (36.4 C)   97.6 F (36.4 C)  TempSrc: Oral   Oral  SpO2:  99% 99%   Weight:      Height:        Intake/Output Summary (Last 24 hours) at 02/07/2019 1058 Last data filed at 02/07/2019 0600 Gross per 24 hour  Intake 240 ml  Output  880 ml  Net -640 ml   Filed Weights   02/04/19 2013  Weight: 54.8 kg    Examination:  General exam: Appears calm and comfortable, chronically sick looking and thinly built.  On minimum oxygen with good saturations. Respiratory system: Clear to auscultation. Respiratory effort normal.   Cardiovascular system: S1 & S2 heard, RRR. No JVD, murmurs, rubs, gallops or clicks. No pedal  edema. Gastrointestinal system: Abdomen is nondistended, soft and nontender. No organomegaly or masses felt. Normal bowel sounds heard. Central nervous system: Alert and oriented. No focal neurological deficits. Extremities: Symmetric 5 x 5 power. Skin: No rashes, lesions or ulcers Psychiatry: Judgement and insight appear normal. Mood & affect appropriate.     Data Reviewed: I have personally reviewed following labs and imaging studies  CBC: Recent Labs  Lab 02/04/19 2119 02/05/19 0228 02/06/19 0437 02/06/19 0940 02/06/19 0950 02/07/19 0215  WBC 18.2* 17.1* 10.9*  --   --  9.7  NEUTROABS 15.1*  --  8.3*  --   --   --   HGB 13.8 13.6 13.5 13.3   13.3 12.9* 12.7*  HCT 41.3 41.0 40.4 39.0   39.0 38.0* 39.6  MCV 94.1 94.3 94.6  --   --  98.3  PLT 394 390 338  --   --  343   Basic Metabolic Panel: Recent Labs  Lab 02/04/19 2119 02/05/19 0228 02/06/19 0437 02/06/19 0940 02/06/19 0950 02/07/19 0215  NA 131* 135 134* 138   137 138 136  K 3.8 3.8 4.2 3.8   3.9 3.8 4.6  CL 92* 97* 97*  --   --  96*  CO2 25 29 26   --   --  29  GLUCOSE 227* 223* 273*  --   --  300*  BUN 31* 32* 42*  --   --  35*  CREATININE 0.90 1.03 1.18  --   --  1.03  CALCIUM 8.3* 8.5* 8.4*  --   --  8.4*  MG 2.2  --   --   --   --   --   PHOS 3.7  --   --   --   --   --    GFR: Estimated Creatinine Clearance: 64.3 mL/min (by C-G formula based on SCr of 1.03 mg/dL). Liver Function Tests: Recent Labs  Lab 02/04/19 2119  AST 19  ALT 22  ALKPHOS 66  BILITOT 0.3  PROT 5.8*  ALBUMIN 2.0*   No results for input(s): LIPASE, AMYLASE in the last 168 hours. No results for input(s): AMMONIA in the last 168 hours. Coagulation Profile: Recent Labs  Lab 02/04/19 2119  INR 1.2   Cardiac Enzymes: No results for input(s): CKTOTAL, CKMB, CKMBINDEX, TROPONINI in the last 168 hours. BNP (last 3 results) No results for input(s): PROBNP in the last 8760 hours. HbA1C: Recent Labs    02/04/19 2119  HGBA1C  8.7*   CBG: Recent Labs  Lab 02/06/19 1115 02/06/19 1612 02/06/19 2101 02/06/19 2342 02/07/19 0611  GLUCAP 163* 321* 402* 345* 311*   Lipid Profile: No results for input(s): CHOL, HDL, LDLCALC, TRIG, CHOLHDL, LDLDIRECT in the last 72 hours. Thyroid Function Tests: Recent Labs    02/04/19 2119  TSH 1.910   Anemia Panel: No results for input(s): VITAMINB12, FOLATE, FERRITIN, TIBC, IRON, RETICCTPCT in the last 72 hours. Sepsis Labs: No results for input(s): PROCALCITON, LATICACIDVEN in the last 168 hours.  Recent Results (from the past 240 hour(s))  MRSA PCR Screening  Status: None   Collection Time: 02/04/19  6:09 PM   Specimen: Nasopharyngeal  Result Value Ref Range Status   MRSA by PCR NEGATIVE NEGATIVE Final    Comment:        The GeneXpert MRSA Assay (FDA approved for NASAL specimens only), is one component of a comprehensive MRSA colonization surveillance program. It is not intended to diagnose MRSA infection nor to guide or monitor treatment for MRSA infections. Performed at [facility], [address] [place], [place] [address]   SARS Coronavirus 2 [facility] order, Performed in [facility] hospital lab)     Status: None   Collection Time: 02/05/19  9:07 AM  Result Value Ref Range Status   SARS Coronavirus 2 NEGATIVE NEGATIVE Final    Comment: (NOTE) If result is NEGATIVE SARS-CoV-2 target nucleic acids are NOT DETECTED. The SARS-CoV-2 RNA is generally detectable in upper and lower  respiratory specimens during the acute phase of infection. The lowest  concentration of SARS-CoV-2 viral copies this assay can detect is 250  copies / mL. A negative result does not preclude SARS-CoV-2 infection  and should not be used as the sole basis for treatment or other  patient management decisions.  A negative result may occur with  improper specimen collection / handling, submission of specimen other  than nasopharyngeal swab, presence of viral mutation(s)  within the  areas targeted by this assay, and inadequate number of viral copies  (<250 copies / mL). A negative result must be combined with clinical  observations, patient history, and epidemiological information. If result is POSITIVE SARS-CoV-2 target nucleic acids are DETECTED. The SARS-CoV-2 RNA is generally detectable in upper and lower  respiratory specimens dur ing the acute phase of infection.  Positive  results are indicative of active infection with SARS-CoV-2.  Clinical  correlation with patient history and other diagnostic information is  necessary to determine patient infection status.  Positive results do  not rule out bacterial infection or co-infection with other viruses. If result is PRESUMPTIVE POSTIVE SARS-CoV-2 nucleic acids MAY BE PRESENT.   A presumptive positive result was obtained on the submitted specimen  and confirmed on repeat testing.  While 2019 novel coronavirus  (SARS-CoV-2) nucleic acids may be present in the submitted sample  additional confirmatory testing may be necessary for epidemiological  and / or clinical management purposes  to differentiate between  SARS-CoV-2 and other Sarbecovirus currently known to infect humans.  If clinically indicated additional testing with an alternate test  methodology [phone]) is advised. The SARS-CoV-2 RNA is generally  detectable in upper and lower respiratory sp ecimens during the acute  phase of infection. The expected result is Negative. Fact Sheet for Patients:  BoilerBrush.com.cy Fact Sheet for Healthcare Providers: https://pope.com/ This test is not yet approved or cleared by the [place] FDA and has been authorized for detection and/or diagnosis of SARS-CoV-2 by FDA under an Emergency Use Authorization (EUA).  This EUA will remain in effect (meaning this test can be used) for the duration of the COVID-19 declaration under Section 564(b)(1) of the Act,  21 U.S.C. section 360bbb-3(b)(1), unless the authorization is terminated or revoked sooner. Performed at [facility], [address], [place], [place] [address]   Culture, body fluid-bottle     Status: None (Preliminary result)   Collection Time: 02/05/19 11:45 AM   Specimen: Pleura  Result Value Ref Range Status   Specimen Description PLEURAL LEFT  Final   Special Requests NONE  Final  Culture   Final    NO GROWTH 1 DAY Performed at [facility], [place] [address], [place], [place] [address]    Report Status PENDING  Incomplete  Gram stain     Status: None   Collection Time: 02/05/19 11:45 AM   Specimen: Pleura  Result Value Ref Range Status   Specimen Description PLEURAL LEFT  Final   Special Requests NONE  Final   Gram Stain   Final    MODERATE WBC PRESENT, PREDOMINANTLY PMN NO ORGANISMS SEEN Performed at [facility], [place] [address] [place], [place] [address]    Report Status 02/05/2019 FINAL  Final         Radiology Studies: Dg Chest 1 View  Result Date: 02/05/2019 CLINICAL DATA:  Parapneumonic effusion. Right chest tube in place. EXAM: CHEST  1 VIEW COMPARISON:  Chest x-ray dated 02/02/2019 and chest CT dated 02/04/2019 FINDINGS: Right chest tube in place. No pneumothorax. Improved aeration at the right lung base with some residual atelectasis and lung haziness, probably due to re-expansion edema. Chronic cardiomegaly. Pulmonary vascularity is normal. Left lung is clear. Left effusion noted on the prior CT scan is not apparent on this chest x-ray. IMPRESSION: 1. No pneumothorax.  Right chest tube in place. 2. Improved aeration at the right lung base. Electronically Signed   By: Lorriane Shire M.D.   On: 02/05/2019 12:23   Ct Chest Wo Contrast  Result Date: 02/06/2019 CLINICAL DATA:  Chest tube placement. EXAM: CT CHEST WITHOUT CONTRAST TECHNIQUE: Multidetector CT imaging of the chest was performed following the standard protocol without IV contrast.  COMPARISON:  CT chest 02/04/2019 FINDINGS: Cardiovascular: The heart is within normal limits in size and stable. No pericardial effusion. There is tortuosity and calcification of the thoracic aorta and age advanced fairly extensive coronary artery calcifications. Mediastinum/Nodes: Persistent borderline enlarged mediastinal and hilar lymph nodes. Lungs/Pleura: Interval placement of a right-sided chest tube. Significant reduction of pleural effusion seen on the prior chest CT. Some residual loculated. Fluid collection in the right upper lobe. Small amount of residual pleural air. 12 mm superior segment left lower lobe pulmonary nodule on image number 47 of series 4. There is also a small cavitary lesion in the left lower lobe on image number 86 which measures 8 mm. Moderate nodularity along the right major fissure without discrete lesion. I do not see any obvious right lung mass or worrisome right lung nodules. Patchy areas of subpleural atelectasis. Small left pleural effusion with overlying atelectasis. Upper Abdomen: No significant upper abdominal findings. Advanced vascular calcifications. No significant bony findings. Musculoskeletal: No chest wall mass, supraclavicular or axillary adenopathy. Body wall edema is noted. No significant bony findings. IMPRESSION: 1. Right-sided chest tube in place with near complete evacuation of the large pleural fluid collection. Areas of right lower lobe atelectasis persist. 2. Small left pleural effusion with overlying atelectasis. 3. 2 left lower lobe pulmonary lesions as detailed above. PET-CT suggested for further evaluation. 4. Stable borderline enlarged mediastinal and hilar lymph nodes. 5. Diffuse body wall edema and upper abdominal mesenteric edema. 6. Stable age advanced three-vessel coronary artery calcifications and aortic calcifications. Aortic Atherosclerosis (ICD10-I70.0). Electronically Signed   By: Marijo Sanes M.D.   On: 02/06/2019 13:19   Dg Chest Port 1  View  Result Date: 02/06/2019 CLINICAL DATA:  53 year old male status post chest tube placement yesterday for empyema. EXAM: PORTABLE CHEST 1 VIEW COMPARISON:  02/05/2019 chest radiographs, and earlier including [facility] chest CT  02/04/2019. FINDINGS: Portable AP upright view at 0625 hours. Stable right chest tube located at the right lung base. No pneumothorax. Veiling opacity in the right mid and lower lung compatible with residual fluid, which might be within the fissure. Additional patchy atelectasis or scarring at the right lung base. Underlying cardiomegaly. Stable cardiac size and mediastinal contours. Stable left lung. Visualized tracheal air column is within normal limits. Negative visible bowel gas pattern. No acute osseous abnormality identified. IMPRESSION: 1. Stable right chest tube. No pneumothorax. 2. Residual right pleural effusion, some may be loculated and/or within the fissure. 3. Cardiomegaly. Electronically Signed   By: Odessa FlemingH  Hall M.D.   On: 02/06/2019 08:06        Scheduled Meds:  amoxicillin-clavulanate  1 tablet Oral Q12H   aspirin  81 mg Oral Daily   atorvastatin  40 mg Oral q1800   budesonide (PULMICORT) nebulizer solution  0.25 mg Nebulization BID   famotidine  20 mg Oral QHS   feeding supplement (ENSURE ENLIVE)  237 mL Oral BID BM   heparin  5,000 Units Subcutaneous Q8H   insulin aspart  0-5 Units Subcutaneous QHS   insulin aspart  0-9 Units Subcutaneous TID WC   insulin aspart  4 Units Subcutaneous TID WC   insulin detemir  10 Units Subcutaneous QHS   ipratropium-albuterol  3 mL Nebulization TID   isosorbide mononitrate  30 mg Oral Daily   lisinopril  20 mg Oral Daily   metoprolol succinate  100 mg Oral Daily   multivitamin with minerals  1 tablet Oral Daily   sodium chloride flush  3 mL Intravenous Q12H   sodium chloride flush  3 mL Intravenous Q12H   Continuous Infusions:  sodium chloride       LOS: 3 days    Time spent: 25  minutes    Dorcas CarrowKuber Thorin Starner, MD [facility] Pager [phone]  If 7PM-7AM, please contact night-coverage [email or web address] Password [email or web address] 02/07/2019, 10:58 AM

## 2019-02-07 NOTE — Plan of Care (Signed)

## 2019-02-07 NOTE — Telephone Encounter (Signed)
Mark Furbish, MD  P [facility] Triage Pool        2 week f/u with midlevel, at that visit have them check on patient and order CT chest. Forward CT chest results to me so I can d/w patient.   Thanks,  Erskine Emery  ---------------------------------------- Pt is still currently admitted at this time. Will continue to follow up.

## 2019-02-07 NOTE — Social Work (Signed)
Acknowledging consult for CSW assist; however there are no details to address in consult.  CSW signing off. Please consult if any additional needs arise.  Alexander Mt, [place] Work [phone]

## 2019-02-07 NOTE — Progress Notes (Signed)
   NAME:  Mark Pittman, MRN:  [id number], DOB:  1965/12/04, LOS: 3 ADMISSION DATE:  02/04/2019, CONSULTATION DATE:  02/05/19 REFERRING MD:  Sloan Leiter, CHIEF COMPLAINT:  SOB   Brief History   53 year old man presenting with constitutional symptoms found to have large complex R pleural effusion and possible NSTEMI.    Past Medical History  CAD Prior MI Smoker DM  Significant Hospital Events   8/2 admitted 8/3 chest tube, also TPA and DNase infused later that evening. 8/4 approximately 2500 mL of purulent fluid drained from right pleural space since placement of chest tube.  Underwent left heart cath  8/5 chest tube removed. Changed abx to augmentin  Consults:  PCCM, Cardiology  Procedures:  8/3 chest tube 8/4: Left heart cath>>>  Significant Diagnostic Tests:  CT personally reviewed: no PE, small left effusion, dilated atria, large complex R pleural effusion.  There are a couple GG nodules on L and maybe and enlarged 7 node.  Micro Data:  Body fluid 02/05/19>>  Antimicrobials:  Unasyn 8/3>>  augmentin 8/4>>> Interim history/subjective:  Admitted  Objective   Blood pressure (Abnormal) 148/94, pulse 78, temperature 97.6 F (36.4 C), temperature source Oral, resp. rate 10, height 5\' 6"  (1.676 m), weight 54.8 kg, SpO2 99 %.        Intake/Output Summary (Last 24 hours) at 02/07/2019 1013 Last data filed at 02/07/2019 0600 Gross per 24 hour  Intake 240 ml  Output 880 ml  Net -640 ml   Filed Weights   02/04/19 2013  Weight: 54.8 kg    Examination: General this is a pleasant 53 year old chronically ill-appearing male he is sitting up in chair he looks much better today than he slept in the last 24 hours HEENT normocephalic atraumatic no jugular venous distention mucous membranes are moist Pulmonary: Clear to auscultation diminished bases no air leak on chest tube prior to removal chest tube now removed with occlusive dressing in place Cardiac: Regular rate and rhythm without  murmur rub or gallop Abdomen: Soft nontender no organomegaly Extremities: Warm dry brisk cap refill GU: Voids Neuro: Awake oriented no focal deficit.  Resolved Hospital Problem list   NA  Assessment & Plan:  # R empyema ( no organism specified)- in setting of poorly controlled DM; likely cause of chest pains and B symptoms # L lung nodules- hopefully reactive # Unintentional weight loss- suspicion related to ongoing infection more than anything else # New CHF, with newly identified four-vessel coronary artery disease by left heart cath  # Smoking- knows he needs to stop # DM- A1c 8.7% # Chronic benzo use  Discussion No organism specified.  Hemodynamically and clinically much improved.  CT chest reviewed, see above.  It appears as though he had fairly good evacuation with chest tube/TPA/DNase.  Hopefully the rest will clear with antibiotics.  Removed the chest tube during this visit  Plan/recommendation We have changed antibiotics to Augmentin with discharge him to home with 2 weeks  We will see him prior to completion of his antibiotics in our office for planned chest x-ray to determine if further antibiotics needed he will also need follow-up CT of chest in approximately 4 weeks from time of discharge to both reevaluate pleural space as well as follow-up on lymphadenopathy which are hopefully reactive       Erick Colace ACNP-BC [place] Pager # [phone] OR # [phone] if no answer

## 2019-02-07 NOTE — Progress Notes (Signed)
Inpatient Diabetes Program Recommendations  AACE/ADA: New Consensus Statement on Inpatient Glycemic Control (2015)  Target Ranges:  Prepandial:   less than 140 mg/dL      Peak postprandial:   less than 180 mg/dL (1-2 hours)      Critically ill patients:  140 - 180 mg/dL   Lab Results  Component Value Date   GLUCAP 311 (H) 02/07/2019   HGBA1C 8.7 (H) 02/04/2019    Review of Glycemic Control Results for Mark Pittman, Mark Pittman (MRN [id number]) as of 02/07/2019 09:52  Ref. Range 02/06/2019 16:12 02/06/2019 21:01 02/06/2019 23:42 02/07/2019 06:11  Glucose-Capillary Latest Ref Range: 70 - 99 mg/dL 321 (H) 402 (H) 345 (H) 311 (H)   Diabetes history: DM 2 Outpatient Diabetes medications: Metformin BID listed in med rec Current orders for Inpatient glycemic control: Novolog 0-9 units tid, Novolog 0-5 units qhs  A1c 8.7% on 8/2  Inpatient Diabetes Program Recommendations:    Glucose trends are 300-400's mg/dL and will need insulin adjustments.   Consider the following: -Adding Levemir 10 units QD -Adding Novolog 4 units TID (assuming patient is consuming >50% of meal)  Secure chat sent to MD.  Thanks, Bronson Curb, MSN, RNC-OB Diabetes Coordinator [phone] (8a-5p)

## 2019-02-07 NOTE — Plan of Care (Signed)
  Problem: Cardiac: Goal: Ability to achieve and maintain adequate cardiopulmonary perfusion will improve Outcome: Progressing   Problem: Education: Goal: Knowledge of General Education information will improve Description: Including pain rating scale, medication(s)/side effects and non-pharmacologic comfort measures Outcome: Progressing   Problem: Clinical Measurements: Goal: Ability to maintain clinical measurements within normal limits will improve Outcome: Progressing Goal: Will remain free from infection Outcome: Progressing Goal: Diagnostic test results will improve Outcome: Progressing Goal: Respiratory complications will improve Outcome: Progressing Goal: Cardiovascular complication will be avoided Outcome: Progressing

## 2019-02-08 ENCOUNTER — Telehealth: Payer: Self-pay | Admitting: Interventional Cardiology

## 2019-02-08 LAB — GLUCOSE, CAPILLARY: Glucose-Capillary: 129 mg/dL — ABNORMAL HIGH (ref 70–99)

## 2019-02-08 MED ORDER — ATORVASTATIN CALCIUM 40 MG PO TABS: 40.0000 mg | ORAL_TABLET | Freq: Every day | ORAL | 0 refills | Status: DC

## 2019-02-08 MED ORDER — LISINOPRIL 20 MG PO TABS: 20.0000 mg | ORAL_TABLET | Freq: Every day | ORAL | 0 refills | Status: DC

## 2019-02-08 MED ORDER — HYDRALAZINE HCL 20 MG/ML IJ SOLN
5.0000 mg | Freq: Once | INTRAMUSCULAR | Status: AC
  Administered 2019-02-08: 5 mg via INTRAVENOUS
  Filled 2019-02-08: qty 1

## 2019-02-08 MED ORDER — ASPIRIN 81 MG PO CHEW: 81.0000 mg | CHEWABLE_TABLET | Freq: Every day | ORAL | 0 refills | Status: DC

## 2019-02-08 MED ORDER — AMOXICILLIN-POT CLAVULANATE 875-125 MG PO TABS: 1.0000 | ORAL_TABLET | Freq: Two times a day (BID) | ORAL | 0 refills | Status: DC

## 2019-02-08 MED ORDER — METOPROLOL SUCCINATE ER 100 MG PO TB24: 100.0000 mg | ORAL_TABLET | Freq: Every day | ORAL | 0 refills | Status: DC

## 2019-02-08 MED ORDER — GLIPIZIDE 5 MG PO TABS: 5.0000 mg | ORAL_TABLET | Freq: Two times a day (BID) | ORAL | 11 refills | Status: DC

## 2019-02-08 MED ORDER — METFORMIN HCL 500 MG PO TABS: 500.0000 mg | ORAL_TABLET | Freq: Two times a day (BID) | ORAL | 0 refills | Status: DC

## 2019-02-08 MED ORDER — ISOSORBIDE MONONITRATE ER 30 MG PO TB24: 30.0000 mg | ORAL_TABLET | Freq: Every day | ORAL | 0 refills | Status: DC

## 2019-02-08 MED ORDER — CLONAZEPAM 1 MG PO TABS: 1.0000 mg | ORAL_TABLET | Freq: Two times a day (BID) | ORAL | 0 refills | Status: DC | PRN

## 2019-02-08 MED ORDER — IPRATROPIUM-ALBUTEROL 0.5-2.5 (3) MG/3ML IN SOLN: 3.0000 mL | Freq: Two times a day (BID) | RESPIRATORY_TRACT | Status: DC

## 2019-02-08 MED FILL — metFORMIN HCL 500 MG TABS: 500 | 30 days supply | Qty: 60 | Fill #0

## 2019-02-08 MED FILL — glipiZIDE 5 MG TABS: 5 | 30 days supply | Qty: 60 | Fill #0

## 2019-02-08 MED FILL — LISINOPRIL 20 MG TABLET: 20 | 30 days supply | Qty: 30 | Fill #0

## 2019-02-08 MED FILL — AMOX-CLAV 875-125 MG TABLET: 875-125 | 7 days supply | Qty: 14 | Fill #0

## 2019-02-08 MED FILL — ASPIRIN LOW DOSE 81 MG CHEW: 81 | 30 days supply | Qty: 30 | Fill #0

## 2019-02-08 MED FILL — clonazePAM 1 MG TABS: 1 | 15 days supply | Qty: 30 | Fill #0

## 2019-02-08 MED FILL — ATORVASTATIN CALCIUM 40 MG: 40 | 30 days supply | Qty: 30 | Fill #0

## 2019-02-08 MED FILL — ISOSORBIDE MN ER 30 MG TAB: 30 | 30 days supply | Qty: 30 | Fill #0

## 2019-02-08 MED FILL — METOPROLOL SUCCINATE ER 100: 100 | 30 days supply | Qty: 30 | Fill #0

## 2019-02-08 NOTE — Progress Notes (Addendum)
Pt awaiting meds form pharmacy, had taken out iv by his self , areas claened and covered with bandaids no bleeding noted.  Pt left unit ambulating ,@1158  refused the use of a wheelchair.

## 2019-02-08 NOTE — Discharge Summary (Signed)
Physician Discharge Summary  Mark ChessmanJeffrey Pittman RUE:[id number] DOB: 12/22/1965 DOA: 02/04/2019  PCP: Patient, No Pcp Per  Admit date: 02/04/2019 Discharge date: 02/08/2019  Admitted From: home  Disposition:  Home   Recommendations for Outpatient Follow-up:  1. Follow up with PCP in 1-2 weeks 2. Follow up with pulmonary, they will call 3. Follow up with cardiology, they will schedule   Home Health:NA  Equipment/Devices:NA   Discharge Condition:stable   CODE STATUS:full code  Diet recommendation: low carb and low salt diet   Brief/Interim Summary: 53 year old gentleman with history of coronary artery disease, hypertension, congestive heart failure with ejection fraction of 40%, type 2 diabetes on insulin who was admitted to [facility] days ago with about 2 months history of shortness of breath, chest pain, leg swelling in the context of noncompliance to medication and follow-ups.  He was admitted at [facility], had abnormal Lexiscan, also found to have low-grade fever with right-sided loculated pleural effusion, CHF with EF of 35% with recently known normal ejection fraction, mildly elevated troponins.  A CTA of the chest also showed right pleural effusion, left lower lobe tumor but no PE.  For his cardiac and pulmonary work-up, he was transferred to [facility].  Discharge Diagnoses:  Active Problems:   Parapneumonic effusion   Empyema (HCC)   Protein-calorie malnutrition, severe   Shortness of breath   Coronary artery disease   Ischemic cardiomyopathy  Pneumonia, with complicated right empyema: Status post chest tube placement, empyema, almost completely drained. Followed by pulmonology.  Good expansion of the lungs.  Chest tube removed . Previously treated with vancomycin and cefepime.  Cultures negative.  MRSA negative.  Treated with Unasyn.  Negative cultures.  Clinically improved.  WC count normalized. Augmentin for 1 more week.  New onset congestive heart  failure, abnormal stress test: Currently chest pain-free.  Patient remains on aspirin, ACE inhibitor's, beta-blockers and statin.  seen by cardiology. Underwent cardiac cath and found to have triple-vessel disease.  Medical management and follow-up.  Starting on aspirin.  He is tolerating Lasix well.  Added nitrates and tolerating. Dc on beta blockers, ACE, statin, Imdur, aspirin  Lung nodules: smoker, 2 months of weight loss and low appetite.  No sputum production.  Left lower lobe nodule seen on CT scan.  Smoker.  Pulmonology to schedule follow-up outpatient.  Type 2 diabetes: Fairly controlled.  A1C 8.7.  Patient on metformin at home.  Patient does not want to go on insulin at this time.  He was not taking any metformin at home.  He has not taken any medicine for last 6 months.  He wants to go on glipizide and metformin that he wished to do in the past.  Prescribed.   Smoker: Counseled to quit.  He is motivated.  Hypertension: Blood pressures stable.  He is on lisinopril, nitrates now.   Patient up ambulating in the hallway.  Discharge home.  Discharge Instructions  Discharge Instructions    Diet - low sodium heart healthy   Complete by: As directed    Increase activity slowly   Complete by: As directed      Allergies as of 02/08/2019   No Known Allergies     Medication List    STOP taking these medications   amLODipine 10 MG tablet Commonly known as: NORVASC   lisinopril-hydrochlorothiazide 20-12.5 MG tablet Commonly known as: ZESTORETIC   metoprolol tartrate 100 MG tablet Commonly known as: LOPRESSOR   PRESCRIPTION MEDICATION     TAKE  these medications   amoxicillin-clavulanate 875-125 MG tablet Commonly known as: AUGMENTIN Take 1 tablet by mouth every 12 (twelve) hours for 7 days.   aspirin 81 MG chewable tablet Chew 1 tablet (81 mg total) by mouth daily.   atorvastatin 40 MG tablet Commonly known as: LIPITOR Take 1 tablet (40 mg total) by mouth daily at 6  PM.   clonazePAM 1 MG tablet Commonly known as: KLONOPIN Take 1 tablet (1 mg total) by mouth 2 (two) times daily as needed for anxiety.   glipiZIDE 5 MG tablet Commonly known as: GLUCOTROL Take 1 tablet (5 mg total) by mouth 2 (two) times daily.   isosorbide mononitrate 30 MG 24 hr tablet Commonly known as: IMDUR Take 1 tablet (30 mg total) by mouth daily.   lisinopril 20 MG tablet Commonly known as: ZESTRIL Take 1 tablet (20 mg total) by mouth daily.   metFORMIN 500 MG tablet Commonly known as: GLUCOPHAGE Take 1 tablet (500 mg total) by mouth 2 (two) times daily with a meal. What changed:   medication strength  how much to take  when to take this   metoprolol succinate 100 MG 24 hr tablet Commonly known as: TOPROL-XL Take 1 tablet (100 mg total) by mouth daily. Take with or immediately following a meal.      Follow-up Information    [facility] Follow up on 03/05/2019.   Why: 9:30 am with Dr. Jonah Blue Contact information: [address] [id number]         No Known Allergies  Consultations:  PCCM  Cradiology    Procedures/Studies: Dg Chest 1 View  Result Date: 02/05/2019 CLINICAL DATA:  Parapneumonic effusion. Right chest tube in place. EXAM: CHEST  1 VIEW COMPARISON:  Chest x-ray dated 02/02/2019 and chest CT dated 02/04/2019 FINDINGS: Right chest tube in place. No pneumothorax. Improved aeration at the right lung base with some residual atelectasis and lung haziness, probably due to re-expansion edema. Chronic cardiomegaly. Pulmonary vascularity is normal. Left lung is clear. Left effusion noted on the prior CT scan is not apparent on this chest x-ray. IMPRESSION: 1. No pneumothorax.  Right chest tube in place. 2. Improved aeration at the right lung base. Electronically Signed   By: Francene Boyers M.D.   On: 02/05/2019 12:23   Ct Chest Wo Contrast  Result Date:  02/06/2019 CLINICAL DATA:  Chest tube placement. EXAM: CT CHEST WITHOUT CONTRAST TECHNIQUE: Multidetector CT imaging of the chest was performed following the standard protocol without IV contrast. COMPARISON:  CT chest 02/04/2019 FINDINGS: Cardiovascular: The heart is within normal limits in size and stable. No pericardial effusion. There is tortuosity and calcification of the thoracic aorta and age advanced fairly extensive coronary artery calcifications. Mediastinum/Nodes: Persistent borderline enlarged mediastinal and hilar lymph nodes. Lungs/Pleura: Interval placement of a right-sided chest tube. Significant reduction of pleural effusion seen on the prior chest CT. Some residual loculated. Fluid collection in the right upper lobe. Small amount of residual pleural air. 12 mm superior segment left lower lobe pulmonary nodule on image number 47 of series 4. There is also a small cavitary lesion in the left lower lobe on image number 86 which measures 8 mm. Moderate nodularity along the right major fissure without discrete lesion. I do not see any obvious right lung mass or worrisome right lung nodules. Patchy areas of subpleural atelectasis. Small left pleural effusion with overlying atelectasis. Upper Abdomen: No significant upper abdominal findings. Advanced  vascular calcifications. No significant bony findings. Musculoskeletal: No chest wall mass, supraclavicular or axillary adenopathy. Body wall edema is noted. No significant bony findings. IMPRESSION: 1. Right-sided chest tube in place with near complete evacuation of the large pleural fluid collection. Areas of right lower lobe atelectasis persist. 2. Small left pleural effusion with overlying atelectasis. 3. 2 left lower lobe pulmonary lesions as detailed above. PET-CT suggested for further evaluation. 4. Stable borderline enlarged mediastinal and hilar lymph nodes. 5. Diffuse body wall edema and upper abdominal mesenteric edema. 6. Stable age advanced  three-vessel coronary artery calcifications and aortic calcifications. Aortic Atherosclerosis (ICD10-I70.0). Electronically Signed   By: Rudie Meyer M.D.   On: 02/06/2019 13:19   Dg Chest Port 1 View  Result Date: 02/06/2019 CLINICAL DATA:  53 year old male status post chest tube placement yesterday for empyema. EXAM: PORTABLE CHEST 1 VIEW COMPARISON:  02/05/2019 chest radiographs, and earlier including [facility] chest CT 02/04/2019. FINDINGS: Portable AP upright view at 0625 hours. Stable right chest tube located at the right lung base. No pneumothorax. Veiling opacity in the right mid and lower lung compatible with residual fluid, which might be within the fissure. Additional patchy atelectasis or scarring at the right lung base. Underlying cardiomegaly. Stable cardiac size and mediastinal contours. Stable left lung. Visualized tracheal air column is within normal limits. Negative visible bowel gas pattern. No acute osseous abnormality identified. IMPRESSION: 1. Stable right chest tube. No pneumothorax. 2. Residual right pleural effusion, some may be loculated and/or within the fissure. 3. Cardiomegaly. Electronically Signed   By: Odessa Fleming M.D.   On: 02/06/2019 08:06     Subjective: Patient seen and examined.  No overnight events.  He is up about in the hallway and ready to go home.   Discharge Exam: Vitals:   02/08/19 0737 02/08/19 0821  BP:    Pulse: 99   Resp:    Temp:    SpO2:  95%   Vitals:   02/08/19 0436 02/08/19 0723 02/08/19 0737 02/08/19 0821  BP: (!) 173/119 (!) 166/107    Pulse:   99   Resp:      Temp:  98.4 F (36.9 C)    TempSrc:  Oral    SpO2:    95%  Weight:      Height:        General: Pt is alert, awake, not in acute distress, on room air and walking. Cardiovascular: RRR, S1/S2 +, no rubs, no gallops Respiratory: CTA bilaterally, no wheezing, no rhonchi.  Dressing intact on the right chest.  No drainage. Abdominal: Soft, NT, ND, bowel sounds  + Extremities: no edema, no cyanosis    The results of significant diagnostics from this hospitalization (including imaging, microbiology, ancillary and laboratory) are listed below for reference.     Microbiology: Recent Results (from the past 240 hour(s))  MRSA PCR Screening     Status: None   Collection Time: 02/04/19  6:09 PM   Specimen: Nasopharyngeal  Result Value Ref Range Status   MRSA by PCR NEGATIVE NEGATIVE Final    Comment:        The GeneXpert MRSA Assay (FDA approved for NASAL specimens only), is one component of a comprehensive MRSA colonization surveillance program. It is not intended to diagnose MRSA infection nor to guide or monitor treatment for MRSA infections. Performed at [facility], [address] [place], [place] [address]   SARS Coronavirus 2 [facility] order, Performed in [facility] hospital lab)  Status: None   Collection Time: 02/05/19  9:07 AM  Result Value Ref Range Status   SARS Coronavirus 2 NEGATIVE NEGATIVE Final    Comment: (NOTE) If result is NEGATIVE SARS-CoV-2 target nucleic acids are NOT DETECTED. The SARS-CoV-2 RNA is generally detectable in upper and lower  respiratory specimens during the acute phase of infection. The lowest  concentration of SARS-CoV-2 viral copies this assay can detect is 250  copies / mL. A negative result does not preclude SARS-CoV-2 infection  and should not be used as the sole basis for treatment or other  patient management decisions.  A negative result may occur with  improper specimen collection / handling, submission of specimen other  than nasopharyngeal swab, presence of viral mutation(s) within the  areas targeted by this assay, and inadequate number of viral copies  (<250 copies / mL). A negative result must be combined with clinical  observations, patient history, and epidemiological information. If result is POSITIVE SARS-CoV-2 target nucleic acids are DETECTED. The SARS-CoV-2 RNA is  generally detectable in upper and lower  respiratory specimens dur ing the acute phase of infection.  Positive  results are indicative of active infection with SARS-CoV-2.  Clinical  correlation with patient history and other diagnostic information is  necessary to determine patient infection status.  Positive results do  not rule out bacterial infection or co-infection with other viruses. If result is PRESUMPTIVE POSTIVE SARS-CoV-2 nucleic acids MAY BE PRESENT.   A presumptive positive result was obtained on the submitted specimen  and confirmed on repeat testing.  While 2019 novel coronavirus  (SARS-CoV-2) nucleic acids may be present in the submitted sample  additional confirmatory testing may be necessary for epidemiological  and / or clinical management purposes  to differentiate between  SARS-CoV-2 and other Sarbecovirus currently known to infect humans.  If clinically indicated additional testing with an alternate test  methodology [phone]([id number]) is advised. The SARS-CoV-2 RNA is generally  detectable in upper and lower respiratory sp ecimens during the acute  phase of infection. The expected result is Negative. Fact Sheet for Patients:  BoilerBrush.com.cyhttps://www.fda.gov/media/136312/download Fact Sheet for Healthcare Providers: https://pope.com/https://www.fda.gov/media/136313/download This test is not yet approved or cleared by the [place] FDA and has been authorized for detection and/or diagnosis of SARS-CoV-2 by FDA under an Emergency Use Authorization (EUA).  This EUA will remain in effect (meaning this test can be used) for the duration of the COVID-19 declaration under Section 564(b)(1) of the Act, 21 U.S.C. section 360bbb-3(b)(1), unless the authorization is terminated or revoked sooner. Performed at [facility], [address] [place], [place] [address]   Culture, body fluid-bottle     Status: None (Preliminary result)   Collection Time: 02/05/19 11:45 AM   Specimen: Pleura  Result  Value Ref Range Status   Specimen Description PLEURAL LEFT  Final   Special Requests NONE  Final   Culture   Final    NO GROWTH 2 DAYS Performed at [facility], [address] [place], [place] [address]    Report Status PENDING  Incomplete  Gram stain     Status: None   Collection Time: 02/05/19 11:45 AM   Specimen: Pleura  Result Value Ref Range Status   Specimen Description PLEURAL LEFT  Final   Special Requests NONE  Final   Gram Stain   Final    MODERATE WBC PRESENT, PREDOMINANTLY PMN NO ORGANISMS SEEN Performed at [facility], [address] [place], [place] [address]  Report Status 02/05/2019 FINAL  Final     Labs: BNP (last 3 results) Recent Labs    02/04/19 2119  BNP 4,098.1*   Basic Metabolic Panel: Recent Labs  Lab 02/04/19 2119 02/05/19 0228 02/06/19 0437 02/06/19 0940 02/06/19 0950 02/07/19 0215  NA 131* 135 134* 138  137 138 136  K 3.8 3.8 4.2 3.8  3.9 3.8 4.6  CL 92* 97* 97*  --   --  96*  CO2 25 29 26   --   --  29  GLUCOSE 227* 223* 273*  --   --  300*  BUN 31* 32* 42*  --   --  35*  CREATININE 0.90 1.03 1.18  --   --  1.03  CALCIUM 8.3* 8.5* 8.4*  --   --  8.4*  MG 2.2  --   --   --   --   --   PHOS 3.7  --   --   --   --   --    Liver Function Tests: Recent Labs  Lab 02/04/19 2119  AST 19  ALT 22  ALKPHOS 66  BILITOT 0.3  PROT 5.8*  ALBUMIN 2.0*   No results for input(s): LIPASE, AMYLASE in the last 168 hours. No results for input(s): AMMONIA in the last 168 hours. CBC: Recent Labs  Lab 02/04/19 2119 02/05/19 0228 02/06/19 0437 02/06/19 0940 02/06/19 0950 02/07/19 0215  WBC 18.2* 17.1* 10.9*  --   --  9.7  NEUTROABS 15.1*  --  8.3*  --   --   --   HGB 13.8 13.6 13.5 13.3  13.3 12.9* 12.7*  HCT 41.3 41.0 40.4 39.0  39.0 38.0* 39.6  MCV 94.1 94.3 94.6  --   --  98.3  PLT 394 390 338  --   --  343   Cardiac Enzymes: No results for input(s): CKTOTAL, CKMB, CKMBINDEX, TROPONINI in the last 168  hours. BNP: Invalid input(s): POCBNP CBG: Recent Labs  Lab 02/07/19 0611 02/07/19 1105 02/07/19 1557 02/07/19 2116 02/08/19 0634  GLUCAP 311* 315* 214* 257* 129*   D-Dimer No results for input(s): DDIMER in the last 72 hours. Hgb A1c No results for input(s): HGBA1C in the last 72 hours. Lipid Profile No results for input(s): CHOL, HDL, LDLCALC, TRIG, CHOLHDL, LDLDIRECT in the last 72 hours. Thyroid function studies No results for input(s): TSH, T4TOTAL, T3FREE, THYROIDAB in the last 72 hours.  Invalid input(s): FREET3 Anemia work up No results for input(s): VITAMINB12, FOLATE, FERRITIN, TIBC, IRON, RETICCTPCT in the last 72 hours. Urinalysis No results found for: COLORURINE, APPEARANCEUR, [place], [place], [place], [place], [place], [place], PROTEINUR, UROBILINOGEN, NITRITE, LEUKOCYTESUR Sepsis Labs Invalid input(s): PROCALCITONIN,  WBC,  LACTICIDVEN Microbiology Recent Results (from the past 240 hour(s))  MRSA PCR Screening     Status: None   Collection Time: 02/04/19  6:09 PM   Specimen: Nasopharyngeal  Result Value Ref Range Status   MRSA by PCR NEGATIVE NEGATIVE Final    Comment:        The GeneXpert MRSA Assay (FDA approved for NASAL specimens only), is one component of a comprehensive MRSA colonization surveillance program. It is not intended to diagnose MRSA infection nor to guide or monitor treatment for MRSA infections. Performed at [facility], [place] [address], [place], [place] [address]   SARS Coronavirus 2 [facility] order, Performed in [facility] hospital lab)     Status: None   Collection Time: 02/05/19  9:07 AM  Result Value  Ref Range Status   SARS Coronavirus 2 NEGATIVE NEGATIVE Final    Comment: (NOTE) If result is NEGATIVE SARS-CoV-2 target nucleic acids are NOT DETECTED. The SARS-CoV-2 RNA is generally detectable in upper and lower  respiratory specimens during the acute phase of infection. The lowest  concentration of SARS-CoV-2  viral copies this assay can detect is 250  copies / mL. A negative result does not preclude SARS-CoV-2 infection  and should not be used as the sole basis for treatment or other  patient management decisions.  A negative result may occur with  improper specimen collection / handling, submission of specimen other  than nasopharyngeal swab, presence of viral mutation(s) within the  areas targeted by this assay, and inadequate number of viral copies  (<250 copies / mL). A negative result must be combined with clinical  observations, patient history, and epidemiological information. If result is POSITIVE SARS-CoV-2 target nucleic acids are DETECTED. The SARS-CoV-2 RNA is generally detectable in upper and lower  respiratory specimens dur ing the acute phase of infection.  Positive  results are indicative of active infection with SARS-CoV-2.  Clinical  correlation with patient history and other diagnostic information is  necessary to determine patient infection status.  Positive results do  not rule out bacterial infection or co-infection with other viruses. If result is PRESUMPTIVE POSTIVE SARS-CoV-2 nucleic acids MAY BE PRESENT.   A presumptive positive result was obtained on the submitted specimen  and confirmed on repeat testing.  While 2019 novel coronavirus  (SARS-CoV-2) nucleic acids may be present in the submitted sample  additional confirmatory testing may be necessary for epidemiological  and / or clinical management purposes  to differentiate between  SARS-CoV-2 and other Sarbecovirus currently known to infect humans.  If clinically indicated additional testing with an alternate test  methodology [phone]([id number]) is advised. The SARS-CoV-2 RNA is generally  detectable in upper and lower respiratory sp ecimens during the acute  phase of infection. The expected result is Negative. Fact Sheet for Patients:  BoilerBrush.com.cyhttps://www.fda.gov/media/136312/download Fact Sheet for Healthcare  Providers: https://pope.com/https://www.fda.gov/media/136313/download This test is not yet approved or cleared by the [place] FDA and has been authorized for detection and/or diagnosis of SARS-CoV-2 by FDA under an Emergency Use Authorization (EUA).  This EUA will remain in effect (meaning this test can be used) for the duration of the COVID-19 declaration under Section 564(b)(1) of the Act, 21 U.S.C. section 360bbb-3(b)(1), unless the authorization is terminated or revoked sooner. Performed at [facility], [address] [place], [place] [address]   Culture, body fluid-bottle     Status: None (Preliminary result)   Collection Time: 02/05/19 11:45 AM   Specimen: Pleura  Result Value Ref Range Status   Specimen Description PLEURAL LEFT  Final   Special Requests NONE  Final   Culture   Final    NO GROWTH 2 DAYS Performed at [facility], [address] [place], [place] [address]    Report Status PENDING  Incomplete  Gram stain     Status: None   Collection Time: 02/05/19 11:45 AM   Specimen: Pleura  Result Value Ref Range Status   Specimen Description PLEURAL LEFT  Final   Special Requests NONE  Final   Gram Stain   Final    MODERATE WBC PRESENT, PREDOMINANTLY PMN NO ORGANISMS SEEN Performed at [facility], [address] [place], [place] [address]    Report Status 02/05/2019 FINAL  Final  Time coordinating discharge: 35 minutes  SIGNED:   Dorcas Carrow, MD  [facility] 02/08/2019, 11:36 AM

## 2019-02-08 NOTE — Telephone Encounter (Signed)
Pt is still currently admitted. 

## 2019-02-09 NOTE — Telephone Encounter (Signed)
Spoke with pt. He has been scheduled to see Tammy on 02/22/2019 at 1530. Nothing further was needed at this time.

## 2019-02-12 LAB — CULTURE, BODY FLUID W GRAM STAIN -BOTTLE

## 2019-02-13 NOTE — Telephone Encounter (Signed)
Per Dr. Eldridge Dace okay to arrange virtual visit tomorrow afternoon. Called and spoke to patient. Patient agrees to [facility] Visit tomorrow with Dr. Eldridge Dace at 2:00 PM. Patient understands Dr. Eldridge Dace is in the hospital and that there may be a little delay with his appointment.      Virtual Visit Pre-Appointment Phone Call  TELEPHONE CALL NOTE  Mark Pittman has been deemed a candidate for a follow-up tele-health visit to limit community exposure during the Covid-19 pandemic. I spoke with the patient via phone to ensure availability of phone/video source, confirm preferred email & phone number, and discuss instructions and expectations.  I reminded Mark Pittman to be prepared with any vital sign and/or heart rhythm information that could potentially be obtained via home monitoring, at the time of his visit. I reminded Mark Pittman to expect a phone call prior to his visit.  Patient agrees to consent below.  Lattie Haw, RN 02/13/2019 2:10 PM   FULL LENGTH CONSENT FOR TELE-HEALTH VISIT   I hereby voluntarily request, consent and authorize [facility] and its employed or contracted physicians, physician assistants, nurse practitioners or other licensed health care professionals (the Practitioner), to provide me with telemedicine health care services (the "Services") as deemed necessary by the treating Practitioner. I acknowledge and consent to receive the Services by the Practitioner via telemedicine. I understand that the telemedicine visit will involve communicating with the Practitioner through live audiovisual communication technology and the disclosure of certain medical information by electronic transmission. I acknowledge that I have been given the opportunity to request an in-person assessment or other available alternative prior to the telemedicine visit and am voluntarily participating in the telemedicine visit.  I understand that I have the right to withhold or  withdraw my consent to the use of telemedicine in the course of my care at any time, without affecting my right to future care or treatment, and that the Practitioner or I may terminate the telemedicine visit at any time. I understand that I have the right to inspect all information obtained and/or recorded in the course of the telemedicine visit and may receive copies of available information for a reasonable fee.  I understand that some of the potential risks of receiving the Services via telemedicine include:  Marland Kitchen Delay or interruption in medical evaluation due to technological equipment failure or disruption; . Information transmitted may not be sufficient (e.g. poor resolution of images) to allow for appropriate medical decision making by the Practitioner; and/or  . In rare instances, security protocols could fail, causing a breach of personal health information.  Furthermore, I acknowledge that it is my responsibility to provide information about my medical history, conditions and care that is complete and accurate to the best of my ability. I acknowledge that Practitioner's advice, recommendations, and/or decision may be based on factors not within their control, such as incomplete or inaccurate data provided by me or distortions of diagnostic images or specimens that may result from electronic transmissions. I understand that the practice of medicine is not an exact science and that Practitioner makes no warranties or guarantees regarding treatment outcomes. I acknowledge that I will receive a copy of this consent concurrently upon execution via email to the email address I last provided but may also request a printed copy by calling the office of [facility].    I understand that my insurance will be billed for this visit.   I have read or had this consent read to me. . I understand the  contents of this consent, which adequately explains the benefits and risks of the Services being provided via  telemedicine.  . I have been provided ample opportunity to ask questions regarding this consent and the Services and have had my questions answered to my satisfaction. . I give my informed consent for the services to be provided through the use of telemedicine in my medical care  By participating in this telemedicine visit I agree to the above.
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
## 2019-02-14 ENCOUNTER — Telehealth (INDEPENDENT_AMBULATORY_CARE_PROVIDER_SITE_OTHER): Payer: Self-pay | Admitting: Interventional Cardiology

## 2019-02-14 ENCOUNTER — Encounter: Payer: Self-pay | Admitting: Interventional Cardiology

## 2019-02-14 ENCOUNTER — Other Ambulatory Visit: Payer: Self-pay

## 2019-02-14 VITALS — HR 89 | Ht 66.0 in | Wt 118.0 lb

## 2019-02-14 DIAGNOSIS — Z720 Tobacco use: Secondary | ICD-10-CM

## 2019-02-14 DIAGNOSIS — I25118 Atherosclerotic heart disease of native coronary artery with other forms of angina pectoris: Secondary | ICD-10-CM

## 2019-02-14 DIAGNOSIS — I5022 Chronic systolic (congestive) heart failure: Secondary | ICD-10-CM

## 2019-02-14 MED ORDER — CLOPIDOGREL BISULFATE 75 MG PO TABS: 75.0000 mg | ORAL_TABLET | Freq: Every day | ORAL | 3 refills | Status: DC

## 2019-02-14 MED ORDER — TRAMADOL HCL 50 MG PO TABS: 50.0000 mg | ORAL_TABLET | Freq: Four times a day (QID) | ORAL | 0 refills | Status: DC | PRN

## 2019-02-14 NOTE — Patient Instructions (Addendum)
Medication Instructions:  Your physician has recommended you make the following change in your medication:   1. START: clopidogrel (plavix) 75 mg tablet: Take 1 tablet by mouth once a day (sent to pharmacy)  2. START: tramadol (ultram) 50 mg tablet: Take 1 tablet by mouth ever 6 hours AS NEEDED for pain (called prescription into pharmacy)   Lab work: None Ordered  If you have labs (blood work) drawn today and your tests are completely normal, you will receive your results only by: Marland Kitchen MyChart Message (if you have MyChart) OR . A paper copy in the mail If you have any lab test that is abnormal or we need to change your treatment, we will call you to review the results.  Testing/Procedures: Your physician has recommended that you have a heart catheterization. After this is performed, and if a blockage is found, an angioplasty or stenting procedure may be necessary. Please see handout/brochure given to you today for more information about these procedures.  Follow-Up: . To be determined  Any Other Special Instructions Will Be Listed Below (If Applicable).     [facility] [place], [place] [address] Dept: [phone] Loc: [phone]  Javonte Elenes  02/14/2019  You are scheduled for a Cardiac Catheterization on Tuesday, August 18 with Dr. Larae Grooms.  1. Please arrive at the [facility] (Main Entrance A) at [facility]: [address] [place], [place] [address] at 7:00 AM (This time is two hours before your procedure to ensure your preparation). Free valet parking service is available.   Special note: Every effort is made to have your procedure done on time. Please understand that emergencies sometimes delay scheduled procedures.  2. Diet: Do not eat solid foods after midnight.  The patient may have clear liquids until 5am upon the day of the procedure.  3.  Labs: Last labs 02/07/19  Your Pre-procedure COVID-19 Testing will be done on 02/16/19 at 1:15 PM at [facility] at [address], [place], [place] [address]. Once you arrive at the testing site, stay in the right hand lane, go under the building overhang not the tent. If you are tested under the tent your results may not be back before your procedure. Please be on time for your appointment.  After your swab you will be given a mask to wear and instructed to go home and quarantine/no visitors until after your procedure. If you test positive you will be notified and your procedure will be cancelled.    4. Medication instructions in preparation for your procedure:   Contrast Allergy: No   DO NOT take glipizide the morning of your procedure  DO NOT take metformin the morning of your procedure and for 48 hours after your procedure  On the morning of your procedure, take your Aspirin and Plavix/Clopidogrel and any morning medicines NOT listed above.  You may use sips of water.  5. Plan for one night stay--bring personal belongings. 6. Bring a current list of your medications and current insurance cards. 7. You MUST have a responsible person to drive you home. 8. Someone MUST be with you the first 24 hours after you arrive home or your discharge will be delayed. 9. Please wear clothes that are easy to get on and off and wear slip-on shoes.  Thank you for allowing [place] to care for you!   -- [facility]

## 2019-02-16 ENCOUNTER — Other Ambulatory Visit (HOSPITAL_COMMUNITY)
Admission: RE | Admit: 2019-02-16 | Discharge: 2019-02-16 | Disposition: A | Discharge status: Home / Self Care | Payer: Medicaid Other | Source: Ambulatory Visit | Attending: Interventional Cardiology | Admitting: Interventional Cardiology

## 2019-02-16 DIAGNOSIS — Z01812 Encounter for preprocedural laboratory examination: Secondary | ICD-10-CM | POA: Diagnosis present

## 2019-02-16 DIAGNOSIS — Z20828 Contact with and (suspected) exposure to other viral communicable diseases: Secondary | ICD-10-CM | POA: Diagnosis not present

## 2019-02-16 LAB — SARS CORONAVIRUS 2 (TAT 6-24 HRS): SARS Coronavirus 2: NEGATIVE

## 2019-02-19 ENCOUNTER — Inpatient Hospital Stay (HOSPITAL_COMMUNITY)
Admission: EM | Admit: 2019-02-19 | Discharge: 2019-02-21 | DRG: 246 | Disposition: A | Discharge status: Home / Self Care | Payer: Medicaid Other | Attending: Internal Medicine | Admitting: Internal Medicine

## 2019-02-19 ENCOUNTER — Encounter (HOSPITAL_COMMUNITY): Payer: Self-pay

## 2019-02-19 ENCOUNTER — Emergency Department (HOSPITAL_COMMUNITY): Payer: Medicaid Other

## 2019-02-19 ENCOUNTER — Other Ambulatory Visit: Payer: Self-pay

## 2019-02-19 ENCOUNTER — Telehealth: Payer: Self-pay | Admitting: *Deleted

## 2019-02-19 DIAGNOSIS — I25110 Atherosclerotic heart disease of native coronary artery with unstable angina pectoris: Principal | ICD-10-CM | POA: Diagnosis present

## 2019-02-19 DIAGNOSIS — Z79891 Long term (current) use of opiate analgesic: Secondary | ICD-10-CM | POA: Diagnosis not present

## 2019-02-19 DIAGNOSIS — Z7982 Long term (current) use of aspirin: Secondary | ICD-10-CM

## 2019-02-19 DIAGNOSIS — Z79899 Other long term (current) drug therapy: Secondary | ICD-10-CM

## 2019-02-19 DIAGNOSIS — Z8701 Personal history of pneumonia (recurrent): Secondary | ICD-10-CM

## 2019-02-19 DIAGNOSIS — D638 Anemia in other chronic diseases classified elsewhere: Secondary | ICD-10-CM | POA: Diagnosis present

## 2019-02-19 DIAGNOSIS — E785 Hyperlipidemia, unspecified: Secondary | ICD-10-CM | POA: Diagnosis present

## 2019-02-19 DIAGNOSIS — E118 Type 2 diabetes mellitus with unspecified complications: Secondary | ICD-10-CM

## 2019-02-19 DIAGNOSIS — R079 Chest pain, unspecified: Secondary | ICD-10-CM | POA: Diagnosis present

## 2019-02-19 DIAGNOSIS — R911 Solitary pulmonary nodule: Secondary | ICD-10-CM | POA: Diagnosis present

## 2019-02-19 DIAGNOSIS — Z7984 Long term (current) use of oral hypoglycemic drugs: Secondary | ICD-10-CM

## 2019-02-19 DIAGNOSIS — J869 Pyothorax without fistula: Secondary | ICD-10-CM | POA: Diagnosis present

## 2019-02-19 DIAGNOSIS — F1290 Cannabis use, unspecified, uncomplicated: Secondary | ICD-10-CM | POA: Diagnosis present

## 2019-02-19 DIAGNOSIS — Z955 Presence of coronary angioplasty implant and graft: Secondary | ICD-10-CM

## 2019-02-19 DIAGNOSIS — Z8249 Family history of ischemic heart disease and other diseases of the circulatory system: Secondary | ICD-10-CM

## 2019-02-19 DIAGNOSIS — Z87891 Personal history of nicotine dependence: Secondary | ICD-10-CM | POA: Diagnosis not present

## 2019-02-19 DIAGNOSIS — I160 Hypertensive urgency: Secondary | ICD-10-CM | POA: Diagnosis present

## 2019-02-19 DIAGNOSIS — I5023 Acute on chronic systolic (congestive) heart failure: Secondary | ICD-10-CM | POA: Diagnosis present

## 2019-02-19 DIAGNOSIS — Z9119 Patient's noncompliance with other medical treatment and regimen: Secondary | ICD-10-CM | POA: Diagnosis not present

## 2019-02-19 DIAGNOSIS — E119 Type 2 diabetes mellitus without complications: Secondary | ICD-10-CM | POA: Diagnosis present

## 2019-02-19 DIAGNOSIS — R0602 Shortness of breath: Secondary | ICD-10-CM

## 2019-02-19 DIAGNOSIS — I252 Old myocardial infarction: Secondary | ICD-10-CM

## 2019-02-19 DIAGNOSIS — Z7902 Long term (current) use of antithrombotics/antiplatelets: Secondary | ICD-10-CM | POA: Diagnosis not present

## 2019-02-19 DIAGNOSIS — I255 Ischemic cardiomyopathy: Secondary | ICD-10-CM | POA: Diagnosis not present

## 2019-02-19 DIAGNOSIS — Z20828 Contact with and (suspected) exposure to other viral communicable diseases: Secondary | ICD-10-CM | POA: Diagnosis present

## 2019-02-19 DIAGNOSIS — I110 Hypertensive heart disease with heart failure: Secondary | ICD-10-CM | POA: Diagnosis present

## 2019-02-19 DIAGNOSIS — Z9114 Patient's other noncompliance with medication regimen: Secondary | ICD-10-CM

## 2019-02-19 DIAGNOSIS — J948 Other specified pleural conditions: Secondary | ICD-10-CM | POA: Diagnosis present

## 2019-02-19 DIAGNOSIS — I2510 Atherosclerotic heart disease of native coronary artery without angina pectoris: Secondary | ICD-10-CM

## 2019-02-19 DIAGNOSIS — I509 Heart failure, unspecified: Secondary | ICD-10-CM

## 2019-02-19 LAB — CBC
HCT: 38.9 % — ABNORMAL LOW (ref 39.0–52.0)
Hemoglobin: 12.7 g/dL — ABNORMAL LOW (ref 13.0–17.0)
MCH: 31.4 pg (ref 26.0–34.0)
MCHC: 32.6 g/dL (ref 30.0–36.0)
MCV: 96 fL (ref 80.0–100.0)
Platelets: 359 10*3/uL (ref 150–400)
RBC: 4.05 MIL/uL — ABNORMAL LOW (ref 4.22–5.81)
RDW: 14 % (ref 11.5–15.5)
WBC: 12.5 10*3/uL — ABNORMAL HIGH (ref 4.0–10.5)
nRBC: 0 % (ref 0.0–0.2)

## 2019-02-19 LAB — CBG MONITORING, ED: Glucose-Capillary: 97 mg/dL (ref 70–99)

## 2019-02-19 LAB — BASIC METABOLIC PANEL
Anion gap: 11 (ref 5–15)
BUN: 12 mg/dL (ref 6–20)
CO2: 23 mmol/L (ref 22–32)
Calcium: 8.7 mg/dL — ABNORMAL LOW (ref 8.9–10.3)
Chloride: 102 mmol/L (ref 98–111)
Creatinine, Ser: 0.87 mg/dL (ref 0.61–1.24)
GFR calc Af Amer: >60 mL/min (ref >60)
GFR calc non Af Amer: >60 mL/min (ref >60)
Glucose, Bld: 99 mg/dL (ref 70–99)
Potassium: 4.2 mmol/L (ref 3.5–5.1)
Sodium: 136 mmol/L (ref 135–145)

## 2019-02-19 LAB — TROPONIN I (HIGH SENSITIVITY)
Troponin I (High Sensitivity): 35 ng/L — ABNORMAL HIGH (ref <18)
Troponin I (High Sensitivity): 35 ng/L — ABNORMAL HIGH (ref <18)

## 2019-02-19 LAB — BRAIN NATRIURETIC PEPTIDE: B Natriuretic Peptide: >4500 pg/mL — ABNORMAL HIGH (ref 0.0–100.0)

## 2019-02-19 LAB — SARS CORONAVIRUS 2 BY RT PCR (HOSPITAL ORDER, PERFORMED IN ~~LOC~~ HOSPITAL LAB): SARS Coronavirus 2: NEGATIVE

## 2019-02-19 MED ORDER — NITROGLYCERIN IN D5W 200-5 MCG/ML-% IV SOLN: 0.0000 ug/min | INTRAVENOUS | Status: DC

## 2019-02-19 MED ORDER — SODIUM CHLORIDE 0.9% FLUSH
3.0000 mL | Freq: Once | INTRAVENOUS | Status: AC
  Administered 2019-02-19: 3 mL via INTRAVENOUS

## 2019-02-19 MED ORDER — ZOLPIDEM TARTRATE 5 MG PO TABS
5.0000 mg | ORAL_TABLET | Freq: Once | ORAL | Status: AC
  Administered 2019-02-19: 5 mg via ORAL
  Filled 2019-02-19: qty 1

## 2019-02-19 MED ORDER — NITROGLYCERIN 0.4 MG SL SUBL
0.4000 mg | SUBLINGUAL_TABLET | SUBLINGUAL | Status: DC | PRN
  Administered 2019-02-19: 20:00:00 0.4 mg via SUBLINGUAL
  Filled 2019-02-19: qty 1

## 2019-02-19 MED ORDER — FUROSEMIDE 10 MG/ML IJ SOLN
40.0000 mg | Freq: Once | INTRAMUSCULAR | Status: AC
  Administered 2019-02-19: 40 mg via INTRAVENOUS
  Filled 2019-02-19: qty 4

## 2019-02-19 MED ORDER — ASPIRIN 81 MG PO CHEW
324.0000 mg | CHEWABLE_TABLET | Freq: Once | ORAL | Status: AC
  Administered 2019-02-19: 324 mg via ORAL
  Filled 2019-02-19: qty 4

## 2019-02-19 NOTE — ED Notes (Signed)
ED TO INPATIENT HANDOFF REPORT  ED Nurse Name and Phone #: Magnus IvanLouie, RN [phone]  S Name/Age/Gender Mark Pittman 53 y.o. male Room/Bed: 032C/032C  Code Status   Code Status: Prior  Home/SNF/Other Home Patient oriented to: situation Is this baseline? No   Triage Complete: Triage complete  Chief Complaint Chest Pain; Gen Weakness; Fever  Triage Note Pt began having chest pain a couple of days ago that has remained intermittent. Denies any radiation but does endorse that he does feel sweaty & weak from the "tight" feeling in his chest at times. Pt took 81 mg Asa & 1 Nitro tab at home before calling EMS. Upon arrival to ED pt continues to have CP that makes it difficult to breath, spO2 is 97% on RA & pain rated 2/10.    Allergies No Known Allergies  Level of Care/Admitting Diagnosis ED Disposition    ED Disposition Condition Comment   Admit  Hospital Area[facility] [[id number]]  Level of Care: Progressive [102]  Covid Evaluation: N/A  Diagnosis: Acute CHF (congestive heart failure) [facility](HCC) [[id number]][[id number]]  Admitting Physician: Eduard ClosKAKRAKANDY, ARSHAD N [phone]]  Attending Physician: Eduard ClosKAKRAKANDY, ARSHAD N [phone]]  Estimated length of stay: past midnight tomorrow  Certification:: I certify this patient will need inpatient services for at least 2 midnights  PT Class (Do Not Modify): Inpatient [101]  PT Acc Code (Do Not Modify): Private [1]       B Medical/Surgery History Past Medical History:  Diagnosis Date  . CHF (congestive heart failure), NYHA class I (HCC) 2005   patient  . Diabetes (HCC) 2018   Patient  . MI (myocardial infarction) (HCC) 2015   Patient  . Smoker 1987   Source   Past Surgical History:  Procedure Laterality Date  . RIGHT/LEFT HEART CATH AND CORONARY ANGIOGRAPHY N/A 02/06/2019   Procedure: RIGHT/LEFT HEART CATH AND CORONARY ANGIOGRAPHY;  Surgeon: Lennette BihariKelly, Thomas A, MD;  Location: [facility] INVASIVE CV LAB;  Service: Cardiovascular;  Laterality: N/A;      A IV Location/Drains/Wounds Patient Lines/Drains/Airways Status   Active Line/Drains/Airways    Name:   Placement date:   Placement time:   Site:   Days:   Peripheral IV 02/19/19 Left Antecubital   02/19/19    1611    Antecubital   less than 1   Incision (Closed) 02/05/19 Chest Right;Anterior   02/05/19    1215     14          Intake/Output Last 24 hours  Intake/Output Summary (Last 24 hours) at 02/19/2019 2219 Last data filed at 02/19/2019 2016 Gross per 24 hour  Intake -  Output 550 ml  Net -550 ml    Labs/Imaging Results for orders placed or performed during the hospital encounter of 02/19/19 (from the past 48 hour(s))  Basic metabolic panel     Status: Abnormal   Collection Time: 02/19/19  4:17 PM  Result Value Ref Range   Sodium 136 135 - 145 mmol/L   Potassium 4.2 3.5 - 5.1 mmol/L   Chloride 102 98 - 111 mmol/L   CO2 23 22 - 32 mmol/L   Glucose, Bld 99 70 - 99 mg/dL   BUN 12 6 - 20 mg/dL   Creatinine, Ser 5.780.87 0.61 - 1.24 mg/dL   Calcium 8.7 (L) 8.9 - 10.3 mg/dL   GFR calc non Af Amer >60 >60 mL/min   GFR calc Af Amer >60 >60 mL/min   Anion gap 11 5 - 15  Comment: Performed at [facility], [address] [place], [place] [address]  CBC     Status: Abnormal   Collection Time: 02/19/19  4:17 PM  Result Value Ref Range   WBC 12.5 (H) 4.0 - 10.5 K/uL   RBC 4.05 (L) 4.22 - 5.81 MIL/uL   Hemoglobin 12.7 (L) 13.0 - 17.0 g/dL   HCT 60.438.9 (L) 54.039.0 - 98.152.0 %   MCV 96.0 80.0 - 100.0 fL   MCH 31.4 26.0 - 34.0 pg   MCHC 32.6 30.0 - 36.0 g/dL   RDW 19.114.0 47.811.5 - 29.515.5 %   Platelets 359 150 - 400 K/uL   nRBC 0.0 0.0 - 0.2 %    Comment: Performed at [facility], [address] [place], [place] [address]  Troponin I (High Sensitivity)     Status: Abnormal   Collection Time: 02/19/19  4:17 PM  Result Value Ref Range   Troponin I (High Sensitivity) 35 (H) <18 ng/L    Comment: (NOTE) Elevated high sensitivity troponin I (hsTnI) values and significant   changes across serial measurements may suggest ACS but many other  chronic and acute conditions are known to elevate hsTnI results.  Refer to the "Links" section for chest pain algorithms and additional  guidance. Performed at [facility], [address] [place], [place] [address]   Brain natriuretic peptide     Status: Abnormal   Collection Time: 02/19/19  4:17 PM  Result Value Ref Range   B Natriuretic Peptide >4,500.0 (H) 0.0 - 100.0 pg/mL    Comment: Performed at [facility], [address] [place], [place] [address]  CBG monitoring, ED     Status: None   Collection Time: 02/19/19  5:37 PM  Result Value Ref Range   Glucose-Capillary 97 70 - 99 mg/dL  SARS Coronavirus 2 [facility](Hospital order, Performed in [facility] hospital lab) Nasopharyngeal Nasopharyngeal Swab     Status: None   Collection Time: 02/19/19  5:40 PM   Specimen: Nasopharyngeal Swab  Result Value Ref Range   SARS Coronavirus 2 NEGATIVE NEGATIVE    Comment: (NOTE) If result is NEGATIVE SARS-CoV-2 target nucleic acids are NOT DETECTED. The SARS-CoV-2 RNA is generally detectable in upper and lower  respiratory specimens during the acute phase of infection. The lowest  concentration of SARS-CoV-2 viral copies this assay can detect is 250  copies / mL. A negative result does not preclude SARS-CoV-2 infection  and should not be used as the sole basis for treatment or other  patient management decisions.  A negative result may occur with  improper specimen collection / handling, submission of specimen other  than nasopharyngeal swab, presence of viral mutation(s) within the  areas targeted by this assay, and inadequate number of viral copies  (<250 copies / mL). A negative result must be combined with clinical  observations, patient history, and epidemiological information. If result is POSITIVE SARS-CoV-2 target nucleic acids are DETECTED. The SARS-CoV-2 RNA is generally detectable in upper and lower   respiratory specimens dur ing the acute phase of infection.  Positive  results are indicative of active infection with SARS-CoV-2.  Clinical  correlation with patient history and other diagnostic information is  necessary to determine patient infection status.  Positive results do  not rule out bacterial infection or co-infection with other viruses. If result is PRESUMPTIVE POSTIVE SARS-CoV-2 nucleic acids MAY BE PRESENT.   A presumptive positive result was obtained on the submitted specimen  and confirmed on repeat testing.  While 2019 novel coronavirus  (SARS-CoV-2) nucleic acids may be present in the submitted sample  additional confirmatory testing may be necessary for epidemiological  and / or clinical management purposes  to differentiate between  SARS-CoV-2 and other Sarbecovirus currently known to infect humans.  If clinically indicated additional testing with an alternate test  methodology [phone]) is advised. The SARS-CoV-2 RNA is generally  detectable in upper and lower respiratory sp ecimens during the acute  phase of infection. The expected result is Negative. Fact Sheet for Patients:  StrictlyIdeas.no Fact Sheet for Healthcare Providers: BankingDealers.co.za This test is not yet approved or cleared by the [place] FDA and has been authorized for detection and/or diagnosis of SARS-CoV-2 by FDA under an Emergency Use Authorization (EUA).  This EUA will remain in effect (meaning this test can be used) for the duration of the COVID-19 declaration under Section 564(b)(1) of the Act, 21 U.S.C. section 360bbb-3(b)(1), unless the authorization is terminated or revoked sooner. Performed at [facility], [place] [address], [place], [place] [address]   Troponin I (High Sensitivity)     Status: Abnormal   Collection Time: 02/19/19  6:00 PM  Result Value Ref Range   Troponin I (High Sensitivity) 35 (H) <18 ng/L    Comment:  (NOTE) Elevated high sensitivity troponin I (hsTnI) values and significant  changes across serial measurements may suggest ACS but many other  chronic and acute conditions are known to elevate hsTnI results.  Refer to the "Links" section for chest pain algorithms and additional  guidance. Performed at [facility], [place] [address] [place], [place] [address]    Dg Chest 2 View  Result Date: 02/19/2019 CLINICAL DATA:  Chest pain EXAM: CHEST - 2 VIEW COMPARISON:  02/06/2019 FINDINGS: Stable cardiomegaly. Aortic calcified and tortuous. Interval removal of a right-sided chest tube. A small loculated air and fluid collection at the lateral right lung base persists, compatible with a small residual hydropneumothorax. This has decreased in size compared to the prior study. Small loculated left pleural effusion. The left lung is otherwise clear. IMPRESSION: 1. Interval removal of right-sided chest tube. Small loculated hydropneumothorax persists at the lateral aspect of the right lung base, decreased in size from prior. 2. Small loculated left pleural effusion. Electronically Signed   By: Davina Poke M.D.   On: 02/19/2019 17:00    Pending Labs Unresulted Labs (From admission, onward)   None      Vitals/Pain Today's Vitals   02/19/19 2045 02/19/19 2100 02/19/19 2115 02/19/19 2130  BP: (!) 168/120 (!) 165/121 (!) 160/125 (!) 190/118  Pulse: 80 (!) 39 80 86  Resp: (!) 31 18 17  (!) 22  Temp:      TempSrc:      SpO2: 95% 94% 93% 90%  Weight:      Height:      PainSc:        Isolation Precautions Airborne and Contact precautions  Medications Medications  nitroGLYCERIN (NITROSTAT) SL tablet 0.4 mg (0.4 mg Sublingual Given 02/19/19 2015)  sodium chloride flush (NS) 0.9 % injection 3 mL (3 mLs Intravenous Given 02/19/19 1617)  aspirin chewable tablet 324 mg (324 mg Oral Given 02/19/19 1734)  furosemide (LASIX) injection 40 mg (40 mg Intravenous Given 02/19/19 1901)     Mobility walks with person assist Low fall risk   Focused Assessments Cardiac Assessment Handoff:  Cardiac Rhythm: Normal sinus rhythm No results found for: CKTOTAL, CKMB, CKMBINDEX, TROPONINI  No results found for: DDIMER Does the Patient currently have chest pain? No      R Recommendations: See Admitting Provider Note  Report given to:   Additional Notes: trop x2 : 18; NTG given x1.

## 2019-02-19 NOTE — ED Provider Notes (Signed)
[facility] EMERGENCY DEPARTMENT Provider Note   CSN: [id number] Arrival date & time: 02/19/19  1603    History   Chief Complaint No chief complaint on file.   HPI Mark ChessmanJeffrey Kooyman is a 53 y.o. male with history of CAD, HTN, CHF with EF 25-30%, type 2 diabetes on insulin, tobacco use, recent hospitalization for pneumonia and empyema s/p chest tube and IV antibiotics, presents to the ER for evaluation of chest pain.  Ongoing for the last 2 weeks.  Described as left-sided, squeezing and sharp.  Initially was intermittent but now it seems like it is constant and all the time.  States he was seen in the ER/hospital 1 week ago and was scheduled to have cardiac stents placed tomorrow.  He called his cardiologist today and was told to come to the ER.  The chest pain is worse with moving and exertion.  Initially it was better with nitroglycerin and rest but now this does not help anymore.  The chest pain started last night.  It has been intermittent only getting worse but constant.  He took 3 nitroglycerin and the pain did not improve.  He has associated nausea, sweats, shortness of breath, lightheadedness, palpitations.  He denies any fever, cough, lower extremity edema, orthopnea or PND. Currently having mild/moderate CP.      HPI  Past Medical History:  Diagnosis Date  . CHF (congestive heart failure), NYHA class I (HCC) 2005   patient  . Diabetes (HCC) 2018   Patient  . MI (myocardial infarction) (HCC) 2015   Patient  . Smoker 1987   Source    Patient Active Problem List   Diagnosis Date Noted  . Acute CHF (congestive heart failure) (HCC) 02/19/2019  . Shortness of breath   . Coronary artery disease   . Ischemic cardiomyopathy   . Protein-calorie malnutrition, severe 02/05/2019  . Empyema (HCC)   . Parapneumonic effusion 02/04/2019    Past Surgical History:  Procedure Laterality Date  . RIGHT/LEFT HEART CATH AND CORONARY ANGIOGRAPHY N/A 02/06/2019   Procedure: RIGHT/LEFT HEART CATH AND CORONARY ANGIOGRAPHY;  Surgeon: Lennette BihariKelly, Thomas A, MD;  Location: [facility] INVASIVE CV LAB;  Service: Cardiovascular;  Laterality: N/A;        Home Medications    Prior to Admission medications   Medication Sig Start Date End Date Taking? Authorizing Provider  aspirin 81 MG chewable tablet Chew 1 tablet (81 mg total) by mouth daily. 02/08/19 03/10/19 Yes Dorcas CarrowGhimire, Kuber, MD  atorvastatin (LIPITOR) 40 MG tablet Take 1 tablet (40 mg total) by mouth daily at 6 PM. 02/08/19 03/10/19 Yes Ghimire, Lyndel SafeKuber, MD  clonazePAM (KLONOPIN) 1 MG tablet Take 1 tablet (1 mg total) by mouth 2 (two) times daily as needed for anxiety. 02/08/19  Yes Dorcas CarrowGhimire, Kuber, MD  clopidogrel (PLAVIX) 75 MG tablet Take 1 tablet (75 mg total) by mouth daily. 02/14/19  Yes Corky CraftsVaranasi, Jayadeep S, MD  glipiZIDE (GLUCOTROL) 5 MG tablet Take 1 tablet (5 mg total) by mouth 2 (two) times daily. 02/08/19 02/08/20 Yes Dorcas CarrowGhimire, Kuber, MD  isosorbide mononitrate (IMDUR) 30 MG 24 hr tablet Take 1 tablet (30 mg total) by mouth daily. 02/08/19 03/10/19 Yes Ghimire, Lyndel SafeKuber, MD  lisinopril (ZESTRIL) 20 MG tablet Take 1 tablet (20 mg total) by mouth daily. 02/08/19 03/10/19 Yes Dorcas CarrowGhimire, Kuber, MD  metFORMIN (GLUCOPHAGE) 500 MG tablet Take 1 tablet (500 mg total) by mouth 2 (two) times daily with a meal. 02/08/19 03/10/19 Yes Dorcas CarrowGhimire, Kuber, MD  metoprolol succinate (TOPROL-XL) 100  MG 24 hr tablet Take 1 tablet (100 mg total) by mouth daily. Take with or immediately following a meal. 02/08/19 03/10/19 Yes Ghimire, Lyndel SafeKuber, MD  nitroGLYCERIN (NITROSTAT) 0.4 MG SL tablet Place 0.4 mg under the tongue every 5 (five) minutes as needed for chest pain.   Yes [provider]  traMADol (ULTRAM) 50 MG tablet Take 1 tablet (50 mg total) by mouth every 6 (six) hours as needed for moderate pain. 02/14/19  Yes Corky CraftsVaranasi, Jayadeep S, MD    Family History Family History  Problem Relation Age of Onset  . CAD Mother   . Hypertension Mother     Social  History Social History   Tobacco Use  . Smoking status: Former Smoker    Packs/day: 0.50    Years: 30.00    Pack years: 15.00    Quit date: 02/02/2019    Years since quitting: 0.0  . Smokeless tobacco: Never Used  . Tobacco comment: Patient states he quit, but he will take nicotine patch  Substance Use Topics  . Alcohol use: Not Currently  . Drug use: Yes    Frequency: 2.0 times per week    Types: Marijuana     Allergies   Patient has no known allergies.   Review of Systems Review of Systems  Constitutional: Positive for diaphoresis.  Respiratory: Positive for chest tightness and shortness of breath.   Cardiovascular: Positive for chest pain and palpitations.  Gastrointestinal: Positive for nausea.  Neurological: Positive for light-headedness.  All other systems reviewed and are negative.    Physical Exam Updated Vital Signs BP (!) 168/120   Pulse 80   Temp 98.7 F (37.1 C) (Oral)   Resp (!) 31   Ht 5\' 6"  (1.676 m)   Wt 53.5 kg   SpO2 95%   BMI 19.05 kg/m   Physical Exam Constitutional:      Appearance: He is well-developed.     Comments: NAD. Non toxic.   HENT:     Head: Normocephalic and atraumatic.     Nose: Nose normal.  Eyes:     General: Lids are normal.     Conjunctiva/sclera: Conjunctivae normal.  Neck:     Musculoskeletal: Normal range of motion.     Trachea: Trachea normal.     Comments: Trachea midline.  Cardiovascular:     Rate and Rhythm: Normal rate and regular rhythm.     Pulses:          Radial pulses are 1+ on the right side and 1+ on the left side.       Dorsalis pedis pulses are 1+ on the right side and 1+ on the left side.     Heart sounds: Normal heart sounds, S1 normal and S2 normal.     Comments: No LE edema or calf tenderness.  Pulmonary:     Effort: Pulmonary effort is normal.     Comments: Diminished lung sounds to lower bases, no crackles  Abdominal:     General: Bowel sounds are normal.     Palpations: Abdomen is  soft.     Tenderness: There is no abdominal tenderness.     Comments: No epigastric tenderness. No distention.   Skin:    General: Skin is warm and dry.     Capillary Refill: Capillary refill takes less than 2 seconds.     Comments: No rash to chest wall  Neurological:     Mental Status: He is alert.     GCS: GCS eye  subscore is 4. GCS verbal subscore is 5. GCS motor subscore is 6.  Psychiatric:        Speech: Speech normal.        Behavior: Behavior normal.        Thought Content: Thought content normal.      ED Treatments / Results  Labs (all labs ordered are listed, but only abnormal results are displayed) Labs Reviewed  BASIC METABOLIC PANEL - Abnormal; Notable for the following components:      Result Value   Calcium 8.7 (*)    All other components within normal limits  CBC - Abnormal; Notable for the following components:   WBC 12.5 (*)    RBC 4.05 (*)    Hemoglobin 12.7 (*)    HCT 38.9 (*)    All other components within normal limits  BRAIN NATRIURETIC PEPTIDE - Abnormal; Notable for the following components:   B Natriuretic Peptide >4,500.0 (*)    All other components within normal limits  TROPONIN I (HIGH SENSITIVITY) - Abnormal; Notable for the following components:   Troponin I (High Sensitivity) 35 (*)    All other components within normal limits  TROPONIN I (HIGH SENSITIVITY) - Abnormal; Notable for the following components:   Troponin I (High Sensitivity) 35 (*)    All other components within normal limits  SARS CORONAVIRUS 2 (HOSPITAL ORDER, [facility])  CBG MONITORING, ED    EKG EKG Interpretation  Date/Time:  Monday February 19 2019 16:17:51 EDT Ventricular Rate:  83 PR Interval:    QRS Duration: 96 QT Interval:  365 QTC Calculation: 429 R Axis:   -22 Text Interpretation:  Sinus rhythm Probable left atrial enlargement LVH with secondary repolarization abnormality similar to Feb 05 2019 Confirmed by Sherwood Gambler [phone])  on 02/19/2019 4:44:51 PM   Radiology Dg Chest 2 View  Result Date: 02/19/2019 CLINICAL DATA:  Chest pain EXAM: CHEST - 2 VIEW COMPARISON:  02/06/2019 FINDINGS: Stable cardiomegaly. Aortic calcified and tortuous. Interval removal of a right-sided chest tube. A small loculated air and fluid collection at the lateral right lung base persists, compatible with a small residual hydropneumothorax. This has decreased in size compared to the prior study. Small loculated left pleural effusion. The left lung is otherwise clear. IMPRESSION: 1. Interval removal of right-sided chest tube. Small loculated hydropneumothorax persists at the lateral aspect of the right lung base, decreased in size from prior. 2. Small loculated left pleural effusion. Electronically Signed   By: Davina Poke M.D.   On: 02/19/2019 17:00    Procedures .Critical Care Performed by: Kinnie Feil, PA-C Authorized by: Kinnie Feil, PA-C   Critical care provider statement:    Critical care time (minutes):  45   Critical care was necessary to treat or prevent imminent or life-threatening deterioration of the following conditions:  Cardiac failure   Critical care was time spent personally by me on the following activities:  Discussions with consultants, evaluation of patient's response to treatment, examination of patient, ordering and performing treatments and interventions, ordering and review of laboratory studies, ordering and review of radiographic studies, pulse oximetry, re-evaluation of patient's condition, obtaining history from patient or surrogate, review of old charts and development of treatment plan with patient or surrogate   I assumed direction of critical care for this patient from another provider in my specialty: no     (including critical care time)  Medications Ordered in ED Medications  nitroGLYCERIN (NITROSTAT) SL tablet 0.4  mg (0.4 mg Sublingual Given 02/19/19 2015)  sodium chloride flush (NS) 0.9 %  injection 3 mL (3 mLs Intravenous Given 02/19/19 1617)  aspirin chewable tablet 324 mg (324 mg Oral Given 02/19/19 1734)  furosemide (LASIX) injection 40 mg (40 mg Intravenous Given 02/19/19 1901)     Initial Impression / Assessment and Plan / ED Course  I have reviewed the triage vital signs and the nursing notes.  Pertinent labs & imaging results that were available during my care of the patient were reviewed by me and considered in my medical decision making (see chart for details).  Clinical Course as of Feb 18 2110  [facility] Feb 19, 2019  1645 Sinus rhythm Probable left atrial enlargement LVH with secondary repolarization abnormality similar to Feb 05 2019 Confirmed by Pricilla Loveless [phone]) on 02/19/2019 4:44:51 PM  ED EKG [CG]  1823 B Natriuretic Peptide(!): >4,500.0 [CG]  1824 1. Interval removal of right-sided chest tube. Small loculated hydropneumothorax persists at the lateral aspect of the right lung base, decreased in size from prior. 2. Small loculated left pleural effusion.  DG Chest 2 View [CG]  1824 WBC(!): 12.5 [CG]  1824 Troponin I (High Sensitivity)(!): 35 [CG]  1825 Small loculated L pleural effusion previously seen on CT   DG Chest 2 View [CG]  2048 Spoke to Dr Herma Ard will come see patient recommends med admission   [CG]    Clinical Course User Index [CG] Liberty Handy, PA-C    Patient is EMR reviewed to obtain pertinent PMH.  Patient had recent admission for pneumonia with right empyema s/p chest tube placement, IV antibiotics, 1 week of Augmentin.  While in the hospital he had new onset CHF and an abnormal stress test.  He had a heart cath and found to have triple-vessel disease discharged on medical management on Lasix.  Also noted to have lung nodules concerning for cancer giving smoking history, pulmonology OP recommended for this.  Per cardiology note plan is for PCI of mid LAD and OM tomorrow.  Concern for NSTEMI/unstable angina. He does not look fluid  overloaded so HF exacerbation less likely however given last EF, SOB could be possible.    Arrives with mild CP despite 1 nitroglycerin/ASA en route. HD stable on arrival with   ER work-up reviewed by me remarkable as above, BNP greater than 4500 however no pulmonary edema or vascular congestion on his CXR, this could explain his dyspnea, chest discomfort.  EKG is unchanged from previous, troponin is 35.  Will give some nitroglycerin paste, Lasix and repeat troponin.  Anticipate admission and cardiology consult for HF exacerbation further cardiac management.  2110: discussed with cards fellow who recommends med admission, will see patient. Spoke to hospitalist who will admit patient.  Final Clinical Impressions(s) / ED Diagnoses   Final diagnoses:  3-vessel CAD    ED Discharge Orders    None       Jerrell Mylar 02/19/19 2111    Pricilla Loveless, MD 02/19/19 [phone]
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
## 2019-02-19 NOTE — ED Triage Notes (Signed)
Pt began having chest pain a couple of days ago that has remained intermittent. Denies any radiation but does endorse that he does feel sweaty & weak from the "tight" feeling in his chest at times. Pt took 81 mg Asa & 1 Nitro tab at home before calling EMS. Upon arrival to ED pt continues to have CP that makes it difficult to breath, spO2 is 97% on RA & pain rated 2/10.
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
## 2019-02-19 NOTE — Telephone Encounter (Addendum)
Pt contacted pre-coronary stent intervention  scheduled at [facility] for: Tuesday February 20, 2019 9 AM Verified arrival time and place: [place] [facility]) at: 7 AM  I called patient to review instructions for procedure 02/20/19. I first spoke with patient's father, he stated he was getting ready to take patient to hospital that he had not been feeling well. Pt states he has not been feeling well, not able to sleep, and has been hurting in his chest. I advised pt to use NTG, call 911 now for transport to ED, father verbalized understanding. Pt states he has taken plavix and ASA today.

## 2019-02-20 ENCOUNTER — Other Ambulatory Visit: Payer: Self-pay

## 2019-02-20 ENCOUNTER — Inpatient Hospital Stay (HOSPITAL_COMMUNITY)
Admission: EM | Disposition: A | Discharge status: Home / Self Care | Payer: Self-pay | Source: Home / Self Care | Attending: Internal Medicine

## 2019-02-20 ENCOUNTER — Encounter (HOSPITAL_COMMUNITY): Payer: Self-pay | Admitting: Internal Medicine

## 2019-02-20 ENCOUNTER — Inpatient Hospital Stay (HOSPITAL_COMMUNITY): Payer: Medicaid Other

## 2019-02-20 ENCOUNTER — Ambulatory Visit (HOSPITAL_COMMUNITY): Admission: RE | Admit: 2019-02-20 | Payer: Self-pay | Source: Home / Self Care | Admitting: Interventional Cardiology

## 2019-02-20 DIAGNOSIS — I2582 Chronic total occlusion of coronary artery: Secondary | ICD-10-CM

## 2019-02-20 DIAGNOSIS — R079 Chest pain, unspecified: Secondary | ICD-10-CM | POA: Diagnosis present

## 2019-02-20 DIAGNOSIS — I259 Chronic ischemic heart disease, unspecified: Secondary | ICD-10-CM

## 2019-02-20 DIAGNOSIS — I5021 Acute systolic (congestive) heart failure: Secondary | ICD-10-CM

## 2019-02-20 DIAGNOSIS — I25110 Atherosclerotic heart disease of native coronary artery with unstable angina pectoris: Principal | ICD-10-CM

## 2019-02-20 DIAGNOSIS — J869 Pyothorax without fistula: Secondary | ICD-10-CM

## 2019-02-20 HISTORY — PX: LEFT HEART CATH AND CORONARY ANGIOGRAPHY: CATH118249

## 2019-02-20 HISTORY — PX: CORONARY STENT INTERVENTION: CATH118234

## 2019-02-20 LAB — COMPREHENSIVE METABOLIC PANEL
ALT: 180 U/L — ABNORMAL HIGH (ref 0–44)
AST: 104 U/L — ABNORMAL HIGH (ref 15–41)
Albumin: 2.6 g/dL — ABNORMAL LOW (ref 3.5–5.0)
Alkaline Phosphatase: 103 U/L (ref 38–126)
Anion gap: 15 (ref 5–15)
BUN: 11 mg/dL (ref 6–20)
CO2: 24 mmol/L (ref 22–32)
Calcium: 8.9 mg/dL (ref 8.9–10.3)
Chloride: 101 mmol/L (ref 98–111)
Creatinine, Ser: 0.85 mg/dL (ref 0.61–1.24)
GFR calc Af Amer: >60 mL/min (ref >60)
GFR calc non Af Amer: >60 mL/min (ref >60)
Glucose, Bld: 122 mg/dL — ABNORMAL HIGH (ref 70–99)
Potassium: 3 mmol/L — ABNORMAL LOW (ref 3.5–5.1)
Sodium: 140 mmol/L (ref 135–145)
Total Bilirubin: 0.9 mg/dL (ref 0.3–1.2)
Total Protein: 7.3 g/dL (ref 6.5–8.1)

## 2019-02-20 LAB — GLUCOSE, CAPILLARY
Glucose-Capillary: 136 mg/dL — ABNORMAL HIGH (ref 70–99)
Glucose-Capillary: 135 mg/dL — ABNORMAL HIGH (ref 70–99)
Glucose-Capillary: 276 mg/dL — ABNORMAL HIGH (ref 70–99)

## 2019-02-20 LAB — CBC WITH DIFFERENTIAL/PLATELET
Abs Immature Granulocytes: 0.06 10*3/uL (ref 0.00–0.07)
Basophils Absolute: 0.1 10*3/uL (ref 0.0–0.1)
Basophils Relative: 1 %
Eosinophils Absolute: 0.2 10*3/uL (ref 0.0–0.5)
Eosinophils Relative: 1 %
HCT: 40.5 % (ref 39.0–52.0)
Hemoglobin: 13.7 g/dL (ref 13.0–17.0)
Immature Granulocytes: 1 %
Lymphocytes Relative: 12 %
Lymphs Abs: 1.4 10*3/uL (ref 0.7–4.0)
MCH: 31.4 pg (ref 26.0–34.0)
MCHC: 33.8 g/dL (ref 30.0–36.0)
MCV: 92.7 fL (ref 80.0–100.0)
Monocytes Absolute: 1 10*3/uL (ref 0.1–1.0)
Monocytes Relative: 8 %
Neutro Abs: 9.7 10*3/uL — ABNORMAL HIGH (ref 1.7–7.7)
Neutrophils Relative %: 77 %
Platelets: 369 10*3/uL (ref 150–400)
RBC: 4.37 MIL/uL (ref 4.22–5.81)
RDW: 14.3 % (ref 11.5–15.5)
WBC: 12.4 10*3/uL — ABNORMAL HIGH (ref 4.0–10.5)
nRBC: 0 % (ref 0.0–0.2)

## 2019-02-20 LAB — POCT ACTIVATED CLOTTING TIME
Activated Clotting Time: 208 seconds
Activated Clotting Time: 356 seconds
Activated Clotting Time: 180 seconds
Activated Clotting Time: 235 seconds
Activated Clotting Time: 158 seconds

## 2019-02-20 LAB — CBG MONITORING, ED: Glucose-Capillary: 131 mg/dL — ABNORMAL HIGH (ref 70–99)

## 2019-02-20 SURGERY — CORONARY STENT INTERVENTION
Anesthesia: LOCAL

## 2019-02-20 MED ORDER — CLONAZEPAM 0.5 MG PO TABS
1.0000 mg | ORAL_TABLET | Freq: Two times a day (BID) | ORAL | Status: DC | PRN
  Administered 2019-02-20: 1 mg via ORAL
  Filled 2019-02-20: qty 2

## 2019-02-20 MED ORDER — HYDRALAZINE HCL 20 MG/ML IJ SOLN
5.0000 mg | INTRAMUSCULAR | Status: DC | PRN
  Administered 2019-02-20: 5 mg via INTRAVENOUS
  Filled 2019-02-20: qty 1

## 2019-02-20 MED ORDER — INSULIN ASPART 100 UNIT/ML ~~LOC~~ SOLN
0.0000 [IU] | SUBCUTANEOUS | Status: DC
  Administered 2019-02-20: 06:00:00 1 [IU] via SUBCUTANEOUS
  Administered 2019-02-20: 21:00:00 5 [IU] via SUBCUTANEOUS
  Administered 2019-02-20: 1 [IU] via SUBCUTANEOUS
  Administered 2019-02-21: 05:00:00 2 [IU] via SUBCUTANEOUS
  Administered 2019-02-21: 5 [IU] via SUBCUTANEOUS
  Administered 2019-02-21: 2 [IU] via SUBCUTANEOUS

## 2019-02-20 MED ORDER — LISINOPRIL 20 MG PO TABS
20.0000 mg | ORAL_TABLET | Freq: Every day | ORAL | Status: DC
  Administered 2019-02-20: 20 mg via ORAL
  Filled 2019-02-20: qty 1

## 2019-02-20 MED ORDER — ENOXAPARIN SODIUM 40 MG/0.4ML ~~LOC~~ SOLN
40.0000 mg | SUBCUTANEOUS | Status: DC
  Administered 2019-02-20: 40 mg via SUBCUTANEOUS
  Filled 2019-02-20: qty 0.4

## 2019-02-20 MED ORDER — LIDOCAINE HCL (PF) 1 % IJ SOLN
INTRAMUSCULAR | Status: DC | PRN
  Administered 2019-02-20: 3 mL
  Administered 2019-02-20: 15 mL

## 2019-02-20 MED ORDER — LABETALOL HCL 5 MG/ML IV SOLN
10.0000 mg | INTRAVENOUS | Status: AC | PRN
  Administered 2019-02-20 (×2): 10 mg via INTRAVENOUS

## 2019-02-20 MED ORDER — HYDRALAZINE HCL 20 MG/ML IJ SOLN
INTRAMUSCULAR | Status: DC | PRN
  Administered 2019-02-20 (×2): 10 mg via INTRAVENOUS

## 2019-02-20 MED ORDER — CLOPIDOGREL BISULFATE 300 MG PO TABS
ORAL_TABLET | ORAL | Status: DC | PRN
  Administered 2019-02-20: 300 mg via ORAL

## 2019-02-20 MED ORDER — HYDRALAZINE HCL 20 MG/ML IJ SOLN
INTRAMUSCULAR | Status: AC
  Filled 2019-02-20: qty 1

## 2019-02-20 MED ORDER — HEPARIN (PORCINE) IN NACL 1000-0.9 UT/500ML-% IV SOLN
INTRAVENOUS | Status: DC | PRN
  Administered 2019-02-20: 500 mL

## 2019-02-20 MED ORDER — HEPARIN SODIUM (PORCINE) 1000 UNIT/ML IJ SOLN
INTRAMUSCULAR | Status: AC
  Filled 2019-02-20: qty 1

## 2019-02-20 MED ORDER — SODIUM CHLORIDE 0.9 % IV SOLN: 250.0000 mL | INTRAVENOUS | Status: DC | PRN

## 2019-02-20 MED ORDER — HYDRALAZINE HCL 20 MG/ML IJ SOLN
10.0000 mg | INTRAMUSCULAR | Status: AC | PRN
  Administered 2019-02-20 (×3): 10 mg via INTRAVENOUS

## 2019-02-20 MED ORDER — LIDOCAINE HCL (PF) 1 % IJ SOLN
INTRAMUSCULAR | Status: AC
  Filled 2019-02-20: qty 30

## 2019-02-20 MED ORDER — CLOPIDOGREL BISULFATE 75 MG PO TABS: 75.0000 mg | ORAL_TABLET | Freq: Every day | ORAL | Status: DC

## 2019-02-20 MED ORDER — CLOPIDOGREL BISULFATE 75 MG PO TABS: 75.0000 mg | ORAL_TABLET | ORAL | Status: DC

## 2019-02-20 MED ORDER — POTASSIUM CHLORIDE CRYS ER 20 MEQ PO TBCR
40.0000 meq | EXTENDED_RELEASE_TABLET | Freq: Once | ORAL | Status: AC
  Administered 2019-02-20: 09:00:00 40 meq via ORAL
  Filled 2019-02-20: qty 2

## 2019-02-20 MED ORDER — ONDANSETRON HCL 4 MG/2ML IJ SOLN: 4.0000 mg | Freq: Four times a day (QID) | INTRAMUSCULAR | Status: DC | PRN

## 2019-02-20 MED ORDER — FENTANYL CITRATE (PF) 100 MCG/2ML IJ SOLN
INTRAMUSCULAR | Status: DC | PRN
  Administered 2019-02-20 (×3): 25 ug via INTRAVENOUS

## 2019-02-20 MED ORDER — HEPARIN (PORCINE) IN NACL 1000-0.9 UT/500ML-% IV SOLN
INTRAVENOUS | Status: AC
  Filled 2019-02-20: qty 1000

## 2019-02-20 MED ORDER — ASPIRIN 81 MG PO CHEW: 81.0000 mg | CHEWABLE_TABLET | ORAL | Status: DC

## 2019-02-20 MED ORDER — SODIUM CHLORIDE 0.9% FLUSH
3.0000 mL | Freq: Two times a day (BID) | INTRAVENOUS | Status: DC
  Administered 2019-02-20: 21:00:00 3 mL via INTRAVENOUS

## 2019-02-20 MED ORDER — ONDANSETRON HCL 4 MG PO TABS: 4.0000 mg | ORAL_TABLET | Freq: Four times a day (QID) | ORAL | Status: DC | PRN

## 2019-02-20 MED ORDER — ASPIRIN 81 MG PO CHEW: 81.0000 mg | CHEWABLE_TABLET | Freq: Every day | ORAL | Status: DC

## 2019-02-20 MED ORDER — ACETAMINOPHEN 325 MG PO TABS: 650.0000 mg | ORAL_TABLET | ORAL | Status: DC | PRN

## 2019-02-20 MED ORDER — SODIUM CHLORIDE 0.9 % IV SOLN
INTRAVENOUS | Status: DC
  Administered 2019-02-20: 06:00:00 via INTRAVENOUS

## 2019-02-20 MED ORDER — SODIUM CHLORIDE 0.9 % IV SOLN
INTRAVENOUS | Status: AC | PRN
  Administered 2019-02-20: 10 mL/h via INTRAVENOUS

## 2019-02-20 MED ORDER — HEPARIN SODIUM (PORCINE) 1000 UNIT/ML IJ SOLN
INTRAMUSCULAR | Status: DC | PRN
  Administered 2019-02-20: 6000 [IU] via INTRAVENOUS
  Administered 2019-02-20: 4000 [IU] via INTRAVENOUS

## 2019-02-20 MED ORDER — MIDAZOLAM HCL 2 MG/2ML IJ SOLN
INTRAMUSCULAR | Status: DC | PRN
  Administered 2019-02-20 (×2): 1 mg via INTRAVENOUS
  Administered 2019-02-20: 2 mg via INTRAVENOUS

## 2019-02-20 MED ORDER — CLOPIDOGREL BISULFATE 75 MG PO TABS
75.0000 mg | ORAL_TABLET | Freq: Every day | ORAL | Status: DC
  Administered 2019-02-20 – 2019-02-21 (×2): 75 mg via ORAL
  Filled 2019-02-20 (×2): qty 1

## 2019-02-20 MED ORDER — NITROGLYCERIN 1 MG/10 ML FOR IR/CATH LAB
INTRA_ARTERIAL | Status: DC | PRN
  Administered 2019-02-20: 200 ug via INTRACORONARY

## 2019-02-20 MED ORDER — ATORVASTATIN CALCIUM 40 MG PO TABS
40.0000 mg | ORAL_TABLET | Freq: Every day | ORAL | Status: DC
  Administered 2019-02-20: 40 mg via ORAL
  Filled 2019-02-20: qty 1

## 2019-02-20 MED ORDER — CLOPIDOGREL BISULFATE 300 MG PO TABS
ORAL_TABLET | ORAL | Status: AC
  Filled 2019-02-20: qty 1

## 2019-02-20 MED ORDER — NITROGLYCERIN 1 MG/10 ML FOR IR/CATH LAB
INTRA_ARTERIAL | Status: AC
  Filled 2019-02-20: qty 10

## 2019-02-20 MED ORDER — ACETAMINOPHEN 650 MG RE SUPP: 650.0000 mg | Freq: Four times a day (QID) | RECTAL | Status: DC | PRN

## 2019-02-20 MED ORDER — ACETAMINOPHEN 325 MG PO TABS: 650.0000 mg | ORAL_TABLET | Freq: Four times a day (QID) | ORAL | Status: DC | PRN

## 2019-02-20 MED ORDER — ASPIRIN 81 MG PO CHEW
81.0000 mg | CHEWABLE_TABLET | Freq: Every day | ORAL | Status: DC
  Administered 2019-02-20 – 2019-02-21 (×2): 81 mg via ORAL
  Filled 2019-02-20 (×2): qty 1

## 2019-02-20 MED ORDER — ISOSORBIDE MONONITRATE ER 30 MG PO TB24
30.0000 mg | ORAL_TABLET | Freq: Every day | ORAL | Status: DC
  Administered 2019-02-20 – 2019-02-21 (×2): 30 mg via ORAL
  Filled 2019-02-20 (×2): qty 1

## 2019-02-20 MED ORDER — METOPROLOL SUCCINATE ER 100 MG PO TB24
100.0000 mg | ORAL_TABLET | Freq: Every day | ORAL | Status: DC
  Administered 2019-02-20 – 2019-02-21 (×2): 100 mg via ORAL
  Filled 2019-02-20 (×2): qty 1

## 2019-02-20 MED ORDER — VERAPAMIL HCL 2.5 MG/ML IV SOLN
INTRAVENOUS | Status: AC
  Filled 2019-02-20: qty 2

## 2019-02-20 MED ORDER — FENTANYL CITRATE (PF) 100 MCG/2ML IJ SOLN
INTRAMUSCULAR | Status: AC
  Filled 2019-02-20: qty 2

## 2019-02-20 MED ORDER — HYDRALAZINE HCL 20 MG/ML IJ SOLN: 10.0000 mg | INTRAMUSCULAR | Status: DC | PRN

## 2019-02-20 MED ORDER — MIDAZOLAM HCL 2 MG/2ML IJ SOLN
INTRAMUSCULAR | Status: AC
  Filled 2019-02-20: qty 2

## 2019-02-20 MED ORDER — IOHEXOL 350 MG/ML SOLN
INTRAVENOUS | Status: DC | PRN
  Administered 2019-02-20: 155 mL via INTRA_ARTERIAL

## 2019-02-20 MED ORDER — SODIUM CHLORIDE 0.9% FLUSH: 3.0000 mL | INTRAVENOUS | Status: DC | PRN

## 2019-02-20 SURGICAL SUPPLY — 25 items
BALLN SAPPHIRE 2.0X15 (BALLOONS) ×2
BALLN SAPPHIRE 2.5X15 (BALLOONS) ×2
BALLN SAPPHIRE ~~LOC~~ 3.0X18 (BALLOONS) ×2 IMPLANT
BALLOON SAPPHIRE 2.0X15 (BALLOONS) ×1 IMPLANT
BALLOON SAPPHIRE 2.5X15 (BALLOONS) ×1 IMPLANT
CATH INFINITI JR4 5F (CATHETERS) ×2 IMPLANT
CATH LAUNCHER 6FR EBU 3.75 (CATHETERS) ×2 IMPLANT
CATH LAUNCHER 6FR EBU3.5 (CATHETERS) ×2 IMPLANT
ELECT DEFIB PAD ADLT CADENCE (PAD) ×2 IMPLANT
GLIDESHEATH SLEND SS 6F .021 (SHEATH) IMPLANT
GUIDEWIRE INQWIRE 1.5J.035X260 (WIRE) IMPLANT
INQWIRE 1.5J .035X260CM (WIRE)
KIT ENCORE 26 ADVANTAGE (KITS) ×2 IMPLANT
KIT HEART LEFT (KITS) ×2 IMPLANT
KIT HEMO VALVE WATCHDOG (MISCELLANEOUS) ×2 IMPLANT
PACK CARDIAC CATHETERIZATION (CUSTOM PROCEDURE TRAY) ×2 IMPLANT
SHEATH PINNACLE 6F 10CM (SHEATH) ×2 IMPLANT
SHEATH PROBE COVER 6X72 (BAG) ×2 IMPLANT
STENT SYNERGY DES 2.25X24 (Permanent Stent) ×2 IMPLANT
STENT SYNERGY DES 2.5X38 (Permanent Stent) ×2 IMPLANT
TRANSDUCER W/STOPCOCK (MISCELLANEOUS) ×2 IMPLANT
TUBING CIL FLEX 10 FLL-RA (TUBING) ×2 IMPLANT
WIRE ASAHI PROWATER 180CM (WIRE) ×2 IMPLANT
WIRE EMERALD 3MM-J .035X150CM (WIRE) ×2 IMPLANT
WIRE FIGHTER CROSSING 190CM (WIRE) ×2 IMPLANT

## 2019-02-20 NOTE — Progress Notes (Signed)
Site area: 23fr Rt Fem Site Prior to Removal:  Level 0 Pressure Applied For: 34min Manual:   yes Patient Status During Pull:  A/O Post Pull Site:  Level 0 Post Pull Instructions Given:  Post instructions given and pt understands Post Pull Pulses Present: Rt Pt 2+ Dressing Applied:  Tegaderm and a 4x4 Bedrest begins @ 13:45:00 Comments: Pt leaves cath lab holding area in stable condition. Rt groin is soft, no hematoma, dressing is CDI.

## 2019-02-20 NOTE — Interval H&P Note (Signed)
Cath Lab Visit (complete for each Cath Lab visit)  Clinical Evaluation Leading to the Procedure:   ACS: Yes.    Non-ACS:    Anginal Classification: CCS IV  Anti-ischemic medical therapy: Minimal Therapy (1 class of medications)  Non-Invasive Test Results: No non-invasive testing performed  Prior CABG: No previous CABG   Known low EF   History and Physical Interval Note:  02/20/2019 8:47 AM  Mark Pittman  has presented today for surgery, with the diagnosis of Angina.  The various methods of treatment have been discussed with the patient and family. After consideration of risks, benefits and other options for treatment, the patient has consented to  Procedure(s): CORONARY STENT INTERVENTION (N/A) as a surgical intervention.  The patient's history has been reviewed, patient examined, no change in status, stable for surgery.  I have reviewed the patient's chart and labs.  Questions were answered to the patient's satisfaction.     Larae Grooms

## 2019-02-20 NOTE — H&P (Signed)
History and Physical    Mark Pittman ZSW:[id number] DOB: [birth year] DOA: 02/19/2019  PCP: Patient, No Pcp Per  Patient coming from: Home.  Chief Complaint: Chest pain.  HPI: Mark Pittman is a 53 y.o. male with history of diabetes mellitus type 2 recent admission for empyema requiring chest tube which was subsequently removed and discharge about 10 days ago was on antibiotics for 1 week since discharge has been experiencing some chest pain off and on nonexertional sometimes on lying down with shortness of breath and presents to the ER.  Patient did try taking some sublingual nitroglycerin which had some relief.  Pain is across the chest pressure-like nonradiating.  During recent admission 2 weeks ago patient had a cardiac cath which showed triple-vessel disease and due to patient undergoing treatment for empyema plan was to have cardiac cath today and intervention.  Due to patient's worsening symptoms patient decided to come to the ER.  ED Course: In the ER EKG shows sinus rhythm chest x-ray shows some residual loculated effusion with some hydropneumothorax which was small.  BNP is more than 4500 high-sensitivity troponin was 35.  Mild leukocytosis with WBC count of 12.5 hemoglobin 12.7.  Patient was given Lasix 40 mg IV in the ER cardiology was notified plan is to admit patient for further management of patient's chest pain with known triple-vessel disease from recent cardiac cath.  2D echo showed 25 to 30% EF.  Patient blood pressure is chronically elevated.  Review of Systems: As per HPI, rest all negative.   Past Medical History:  Diagnosis Date   CHF (congestive heart failure), NYHA class I ([place]) 2005   patient   Diabetes ([place]) 2018   Patient   MI (myocardial infarction) [facility]) 2015   Patient   Smoker 1987   Source    Past Surgical History:  Procedure Laterality Date   RIGHT/LEFT HEART CATH AND CORONARY ANGIOGRAPHY N/A 02/06/2019   Procedure: RIGHT/LEFT HEART CATH AND  CORONARY ANGIOGRAPHY;  Surgeon: Troy Sine, MD;  Location: [place] [facility];  Service: Cardiovascular;  Laterality: N/A;     reports that he quit smoking about 2 weeks ago. He has a 15.00 pack-year smoking history. He has never used smokeless tobacco. He reports previous alcohol use. He reports current drug use. Frequency: 2.00 times per week. Drug: Marijuana.  No Known Allergies  Family History  Problem Relation Age of Onset   CAD Mother    Hypertension Mother     Prior to Admission medications   Medication Sig Start Date End Date Taking? Authorizing Provider  aspirin 81 MG chewable tablet Chew 1 tablet (81 mg total) by mouth daily. 02/08/19 03/10/19 Yes Barb Merino, MD  atorvastatin (LIPITOR) 40 MG tablet Take 1 tablet (40 mg total) by mouth daily at 6 PM. 02/08/19 03/10/19 Yes Ghimire, Dante Gang, MD  clonazePAM (KLONOPIN) 1 MG tablet Take 1 tablet (1 mg total) by mouth 2 (two) times daily as needed for anxiety. 02/08/19  Yes Barb Merino, MD  clopidogrel (PLAVIX) 75 MG tablet Take 1 tablet (75 mg total) by mouth daily. 02/14/19  Yes Jettie Booze, MD  glipiZIDE (GLUCOTROL) 5 MG tablet Take 1 tablet (5 mg total) by mouth 2 (two) times daily. 02/08/19 02/08/20 Yes Barb Merino, MD  isosorbide mononitrate (IMDUR) 30 MG 24 hr tablet Take 1 tablet (30 mg total) by mouth daily. 02/08/19 03/10/19 Yes Ghimire, Dante Gang, MD  lisinopril (ZESTRIL) 20 MG tablet Take 1 tablet (20 mg total) by mouth daily. 02/08/19  03/10/19 Yes Dorcas Carrow, MD  metFORMIN (GLUCOPHAGE) 500 MG tablet Take 1 tablet (500 mg total) by mouth 2 (two) times daily with a meal. 02/08/19 03/10/19 Yes Ghimire, Lyndel Safe, MD  metoprolol succinate (TOPROL-XL) 100 MG 24 hr tablet Take 1 tablet (100 mg total) by mouth daily. Take with or immediately following a meal. 02/08/19 03/10/19 Yes Ghimire, Lyndel Safe, MD  nitroGLYCERIN (NITROSTAT) 0.4 MG SL tablet Place 0.4 mg under the tongue every 5 (five) minutes as needed for chest pain.   Yes [provider]  traMADol (ULTRAM) 50 MG tablet Take 1 tablet (50 mg total) by mouth every 6 (six) hours as needed for moderate pain. 02/14/19  Yes Corky Crafts, MD    Physical Exam: Constitutional: Moderately built and nourished. Vitals:   02/19/19 2315 02/19/19 2330 02/19/19 2345 02/20/19 0100  BP: (!) 173/120 (!) 178/130 (!) 170/126 (!) 178/106  Pulse: 82 84 82 83  Resp: 18 (!) 24 (!) 22 16  Temp:      TempSrc:      SpO2: 93% 95% 94% 91%  Weight:      Height:       Eyes: Nonicteric no pallor. ENMT: No discharge from the ears eyes nose and mouth. Neck: No mass felt.  No neck rigidity.  JVD not appreciated. Respiratory: No rhonchi or crepitations. Cardiovascular: S1-S2 heard. Abdomen: Soft nontender bowel sounds present. Musculoskeletal: No edema. Skin: No rash. Neurologic: Alert awake oriented to time place and person.  Moves all extremities. Psychiatric: Appears normal per normal affect.   Labs on Admission: I have personally reviewed following labs and imaging studies  CBC: Recent Labs  Lab 02/19/19 1617  WBC 12.5*  HGB 12.7*  HCT 38.9*  MCV 96.0  PLT 359   Basic Metabolic Panel: Recent Labs  Lab 02/19/19 1617  NA 136  K 4.2  CL 102  CO2 23  GLUCOSE 99  BUN 12  CREATININE 0.87  CALCIUM 8.7*   GFR: Estimated Creatinine Clearance: 74.3 mL/min (by C-G formula based on SCr of 0.87 mg/dL). Liver Function Tests: No results for input(s): AST, ALT, ALKPHOS, BILITOT, PROT, ALBUMIN in the last 168 hours. No results for input(s): LIPASE, AMYLASE in the last 168 hours. No results for input(s): AMMONIA in the last 168 hours. Coagulation Profile: No results for input(s): INR, PROTIME in the last 168 hours. Cardiac Enzymes: No results for input(s): CKTOTAL, CKMB, CKMBINDEX, TROPONINI in the last 168 hours. BNP (last 3 results) No results for input(s): PROBNP in the last 8760 hours. HbA1C: No results for input(s): HGBA1C in the last 72 hours. CBG: Recent  Labs  Lab 02/19/19 1737  GLUCAP 97   Lipid Profile: No results for input(s): CHOL, HDL, LDLCALC, TRIG, CHOLHDL, LDLDIRECT in the last 72 hours. Thyroid Function Tests: No results for input(s): TSH, T4TOTAL, FREET4, T3FREE, THYROIDAB in the last 72 hours. Anemia Panel: No results for input(s): VITAMINB12, FOLATE, FERRITIN, TIBC, IRON, RETICCTPCT in the last 72 hours. Urine analysis: No results found for: COLORURINE, APPEARANCEUR, LABSPEC, PHURINE, GLUCOSEU, HGBUR, BILIRUBINUR, KETONESUR, PROTEINUR, UROBILINOGEN, NITRITE, LEUKOCYTESUR Sepsis Labs: @LABRCNTIP (procalcitonin:4,lacticidven:4) ) Recent Results (from the past 240 hour(s))  SARS CORONAVIRUS 2 Nasal Swab Aptima Multi Swab     Status: None   Collection Time: 02/16/19  1:27 PM   Specimen: Aptima Multi Swab; Nasal Swab  Result Value Ref Range Status   SARS Coronavirus 2 NEGATIVE NEGATIVE Final    Comment: (NOTE) SARS-CoV-2 target nucleic acids are NOT DETECTED. The SARS-CoV-2 RNA is generally  detectable in upper and lower respiratory specimens during the acute phase of infection. Negative results do not preclude SARS-CoV-2 infection, do not rule out co-infections with other pathogens, and should not be used as the sole basis for treatment or other patient management decisions. Negative results must be combined with clinical observations, patient history, and epidemiological information. The expected result is Negative. Fact Sheet for Patients: HairSlick.nohttps://www.fda.gov/media/138098/download Fact Sheet for Healthcare Providers: quierodirigir.comhttps://www.fda.gov/media/138095/download This test is not yet approved or cleared by the [place] FDA and  has been authorized for detection and/or diagnosis of SARS-CoV-2 by FDA under an Emergency Use Authorization (EUA). This EUA will remain  in effect (meaning this test can be used) for the duration of the COVID-19 declaration under Section 56 4(b)(1) of the Act, 21 U.S.C. section  360bbb-3(b)(1), unless the authorization is terminated or revoked sooner. Performed at [facility], [address] [place], [place] [address]   SARS Coronavirus 2 [facility](Hospital order, Performed in [facility] hospital lab) Nasopharyngeal Nasopharyngeal Swab     Status: None   Collection Time: 02/19/19  5:40 PM   Specimen: Nasopharyngeal Swab  Result Value Ref Range Status   SARS Coronavirus 2 NEGATIVE NEGATIVE Final    Comment: (NOTE) If result is NEGATIVE SARS-CoV-2 target nucleic acids are NOT DETECTED. The SARS-CoV-2 RNA is generally detectable in upper and lower  respiratory specimens during the acute phase of infection. The lowest  concentration of SARS-CoV-2 viral copies this assay can detect is 250  copies / mL. A negative result does not preclude SARS-CoV-2 infection  and should not be used as the sole basis for treatment or other  patient management decisions.  A negative result may occur with  improper specimen collection / handling, submission of specimen other  than nasopharyngeal swab, presence of viral mutation(s) within the  areas targeted by this assay, and inadequate number of viral copies  (<250 copies / mL). A negative result must be combined with clinical  observations, patient history, and epidemiological information. If result is POSITIVE SARS-CoV-2 target nucleic acids are DETECTED. The SARS-CoV-2 RNA is generally detectable in upper and lower  respiratory specimens dur ing the acute phase of infection.  Positive  results are indicative of active infection with SARS-CoV-2.  Clinical  correlation with patient history and other diagnostic information is  necessary to determine patient infection status.  Positive results do  not rule out bacterial infection or co-infection with other viruses. If result is PRESUMPTIVE POSTIVE SARS-CoV-2 nucleic acids MAY BE PRESENT.   A presumptive positive result was obtained on the submitted specimen  and confirmed on  repeat testing.  While 2019 novel coronavirus  (SARS-CoV-2) nucleic acids may be present in the submitted sample  additional confirmatory testing may be necessary for epidemiological  and / or clinical management purposes  to differentiate between  SARS-CoV-2 and other Sarbecovirus currently known to infect humans.  If clinically indicated additional testing with an alternate test  methodology [phone]) is advised. The SARS-CoV-2 RNA is generally  detectable in upper and lower respiratory sp ecimens during the acute  phase of infection. The expected result is Negative. Fact Sheet for Patients:  BoilerBrush.com.cyhttps://www.fda.gov/media/136312/download Fact Sheet for Healthcare Providers: https://pope.com/https://www.fda.gov/media/136313/download This test is not yet approved or cleared by the [place] FDA and has been authorized for detection and/or diagnosis of SARS-CoV-2 by FDA under an Emergency Use Authorization (EUA).  This EUA will remain in effect (meaning this test can be used) for the duration of the COVID-19 declaration  under Section 564(b)(1) of the Act, 21 U.S.C. section 360bbb-3(b)(1), unless the authorization is terminated or revoked sooner. Performed at [facility], [address] [place], [place] [address]      Radiological Exams on Admission: Dg Chest 2 View  Result Date: 02/19/2019 CLINICAL DATA:  Chest pain EXAM: CHEST - 2 VIEW COMPARISON:  02/06/2019 FINDINGS: Stable cardiomegaly. Aortic calcified and tortuous. Interval removal of a right-sided chest tube. A small loculated air and fluid collection at the lateral right lung base persists, compatible with a small residual hydropneumothorax. This has decreased in size compared to the prior study. Small loculated left pleural effusion. The left lung is otherwise clear. IMPRESSION: 1. Interval removal of right-sided chest tube. Small loculated hydropneumothorax persists at the lateral aspect of the right lung base, decreased in size from  prior. 2. Small loculated left pleural effusion. Electronically Signed   By: Duanne GuessNicholas  Plundo M.D.   On: 02/19/2019 17:00    EKG: Independently reviewed.  Normal sinus rhythm.  Assessment/Plan Active Problems:   Empyema (HCC)   Coronary artery disease   Acute CHF (congestive heart failure) (HCC)   3-vessel CAD   Chest pain    1. Chest pain with history of CAD with recent cardiac cath showing triple-vessel disease -appreciate cardiology consult patient will be kept n.p.o. past midnight in anticipation of procedure.  Continue antiplatelet agents Imdur metoprolol and statins. 2. Acute on chronic systolic CHF last EF measured during recent admission 2 weeks ago was 25 to 30%.  Received Lasix 40 mg IV in the ER.  Follow intake output daily weights and metabolic panel. 3. Hypertension uncontrolled -in addition to home medications metoprolol Imdur and lisinopril I have placed patient on PRN IV hydralazine.  Follow blood pressure trends. 4. Diabetes mellitus type 2 we will keep patient on sliding scale coverage. 5. Recent empyema requiring chest tube placement of the right.  Chest x-ray does show some residual hydropneumothorax small size not hemodynamically significant.  Left side also showed some small loculated effusion.  Will repeat chest x-ray in the morning.  Closely observe. 6. Anemia appears to be chronic follow CBC. 7. Lung nodule with history of tobacco abuse will need further work-up as outpatient.   DVT prophylaxis: Lovenox. Code Status: Full code. Family Communication: Discussed with patient. Disposition Plan: Home. Consults called: Cardiology. Admission status: The patient.   Eduard ClosArshad N Verdie Barrows MD [facility] Pager [phone].  If 7PM-7AM, please contact night-coverage [email or web address] Password [email or web address]  02/20/2019, 1:38 AM

## 2019-02-20 NOTE — Progress Notes (Signed)
   Progress note   Mark Pittman ELF:[id number] DOB: [birth year] DOA: 02/19/2019  PCP: Patient, No Pcp Per  Patient coming from: Home.  Chief Complaint: Chest pain.  HPI: *Day zero note -please refer to H/P from earlier today for complete history; briefly patient is a readmission for chest pain after being discharged some 2 weeks ago with empyema that required chest tube placement.  Previous cath at that time did show triple-vessel disease with planned future intervention to allow resolution of infection. Given worsening symptoms, findings in the ER including echo showing EF of 25 to 30% cardiology was consulted and pending cardiac catheterization later today.  Assessment/Plan  Active Problems:   Empyema (HCC)   Coronary artery disease   Acute CHF (congestive heart failure) (HCC)   3-vessel CAD   Chest pain  DAY ZERO NOTE Please see H/P for full HPI/History Briefly:  Chest pain with history of multivessel CAD with recent cardiac cath showing triple-vessel disease  Cardiology to take to cath today for further eval/treatment - appreciate their expertise NPO at midnight - advance diet after procedure Continue statin, antiplatelet, core measures with beta blocker, ace as below.  Acute on chronic systolic CHF last EF measured during recent admission 2 weeks ago was 25 to 30%.   Lasix 40 mg IV in the ER x1   BNP undetectable (high) Follow intake output daily weights and metabolic panel. Discuss with cardiology need for diuretics(vs ARNI) post cath given low EF - likely follow up echo outpatient in 3 months to further evaluate EF in hopes recannulization/restoration of blood flow will improve function.   Hypertension, essential. Uncontrolled  ? Medication noncompliance vs reactive in the setting of above Continued on metoprolol, imdur and lisinopril + PRN hydralazine BP still 160s/90s today - will adjust meds post cath if not improving (assuming resolution of chest pain may resolve HTN  at least partially)  Non-insulin dependent: diabetes mellitus type 2  NPO for cath - sliding scale insulin/hypoglycemic protocol ongoing  Recent empyema requiring chest tube placement of the right.   Non significant hydropneumothorax on CXR  Repeat imaging in AM   Anemia, mild, appears to be chronic anemia of chronic disease Follow CBC - baseline around 13/14 12 currently  Lung nodule with history of tobacco abuse  Further imaging/work-up as outpatient.  DVT prophylaxis: Lovenox. Code Status: Full code. Family Communication: Discussed with patient. Disposition Plan: Home. Consults called: Cardiology. Admission status: The patient.  Physical Exam: Constitutional: Moderately built and nourished. Vitals:   02/19/19 2345 02/20/19 0100 02/20/19 0200 02/20/19 0230  BP: (!) 170/126 (!) 178/106 (!) 186/121 (!) 178/110  Pulse: 82 83 (!) 55 92  Resp: (!) 22 16 18 18   Temp:      TempSrc:      SpO2: 94% 91% 91% 96%  Weight:      Height:       Eyes: Nonicteric no pallor. ENMT: No discharge from the ears eyes nose and mouth. Neck: No mass felt.  No neck rigidity.  JVD not appreciated. Respiratory: No rhonchi or crepitations. Cardiovascular: S1-S2 heard. Abdomen: Soft nontender bowel sounds present. Musculoskeletal: No edema. Skin: No rash. Neurologic: Alert awake oriented to time place and person.  Moves all extremities. Psychiatric: Appears normal per normal affect.   Little Ishikawa MD [facility] Pager [phone].  If 7PM-7AM, please contact night-coverage [email or web address] Password [email or web address]  02/20/2019, 8:08 AM

## 2019-02-21 ENCOUNTER — Encounter (HOSPITAL_COMMUNITY): Payer: Self-pay | Admitting: Interventional Cardiology

## 2019-02-21 DIAGNOSIS — I255 Ischemic cardiomyopathy: Secondary | ICD-10-CM

## 2019-02-21 LAB — GLUCOSE, CAPILLARY
Glucose-Capillary: 151 mg/dL — ABNORMAL HIGH (ref 70–99)
Glucose-Capillary: 168 mg/dL — ABNORMAL HIGH (ref 70–99)
Glucose-Capillary: 169 mg/dL — ABNORMAL HIGH (ref 70–99)
Glucose-Capillary: 266 mg/dL — ABNORMAL HIGH (ref 70–99)

## 2019-02-21 LAB — BASIC METABOLIC PANEL
Anion gap: 10 (ref 5–15)
BUN: 15 mg/dL (ref 6–20)
CO2: 27 mmol/L (ref 22–32)
Calcium: 8.6 mg/dL — ABNORMAL LOW (ref 8.9–10.3)
Chloride: 102 mmol/L (ref 98–111)
Creatinine, Ser: 0.92 mg/dL (ref 0.61–1.24)
GFR calc Af Amer: >60 mL/min (ref >60)
GFR calc non Af Amer: >60 mL/min (ref >60)
Glucose, Bld: 139 mg/dL — ABNORMAL HIGH (ref 70–99)
Potassium: 3.6 mmol/L (ref 3.5–5.1)
Sodium: 139 mmol/L (ref 135–145)

## 2019-02-21 LAB — CBC
HCT: 38.6 % — ABNORMAL LOW (ref 39.0–52.0)
Hemoglobin: 12.9 g/dL — ABNORMAL LOW (ref 13.0–17.0)
MCH: 31.1 pg (ref 26.0–34.0)
MCHC: 33.4 g/dL (ref 30.0–36.0)
MCV: 93 fL (ref 80.0–100.0)
Platelets: 335 10*3/uL (ref 150–400)
RBC: 4.15 MIL/uL — ABNORMAL LOW (ref 4.22–5.81)
RDW: 14.9 % (ref 11.5–15.5)
WBC: 11.3 10*3/uL — ABNORMAL HIGH (ref 4.0–10.5)
nRBC: 0 % (ref 0.0–0.2)

## 2019-02-21 MED ORDER — FUROSEMIDE 20 MG PO TABS: 20.0000 mg | ORAL_TABLET | Freq: Every day | ORAL | 0 refills | Status: DC

## 2019-02-21 MED ORDER — LOSARTAN POTASSIUM 50 MG PO TABS
50.0000 mg | ORAL_TABLET | Freq: Every day | ORAL | Status: DC
  Administered 2019-02-21: 09:00:00 50 mg via ORAL
  Filled 2019-02-21: qty 1

## 2019-02-21 MED ORDER — FUROSEMIDE 20 MG PO TABS
20.0000 mg | ORAL_TABLET | Freq: Every day | ORAL | Status: DC
  Administered 2019-02-21: 20 mg via ORAL
  Filled 2019-02-21: qty 1

## 2019-02-21 MED ORDER — LOSARTAN POTASSIUM 50 MG PO TABS: 50.0000 mg | ORAL_TABLET | Freq: Every day | ORAL | 0 refills | Status: DC

## 2019-02-21 MED FILL — Verapamil HCl IV Soln 2.5 MG/ML: INTRAVENOUS | Qty: 2 | Status: AC

## 2019-02-21 MED FILL — LOSARTAN POTASSIUM 50 MG TA: 50 | 30 days supply | Qty: 30 | Fill #0

## 2019-02-21 MED FILL — FUROSEMIDE 20 MG TAB: 20 | 30 days supply | Qty: 30 | Fill #0

## 2019-02-21 NOTE — TOC Initial Note (Signed)
Transition of Care [facility]) - Initial/Assessment Note    Patient Details  Name: Mark Pittman MRN: [id number] Date of Birth: May 07, 1966  [facility] CM/SW Contact:    Bethena Roys, RN Phone Number: 02/21/2019, 11:14 AM  Clinical Narrative:  Pt presented for Acute CHF- post cath-plan for home on Plavix. Patient works part time and lives in [place]. Per patient he has transportation. PCP and hospital follow up appointment scheduled and placed on AVS- Patient will be able to use the Pharmacy at the [facility]. Pt last used [facility] on 02-08-19- unable to use again. Please send new Rx for Plavix to [facility] will utilize petty cash for Plavix only. Patient should have other medications from last admission. No further needs from CM at this time.                 Expected Discharge Plan: Home/Self Care Barriers to Discharge: No Barriers Identified   Patient Goals and CMS Choice Patient states their goals for this hospitalization and ongoing recovery are:: "to return home"   Choice offered to / list presented to : NA  Expected Discharge Plan and Services Expected Discharge Plan: Home/Self Care In-house Referral: NA Discharge Planning Services: Follow-up appt scheduled, Medication Assistance, Indigent Health Clinic(Pt had last utilized [facility] 02-08-19)   Living arrangements for the past 2 months: [place] Arranged: NA          Prior Living Arrangements/Services Living arrangements for the past 2 months: Single Family Home Lives with:: Relatives Patient language and need for interpreter reviewed:: Yes Do you feel safe going back to the place where you live?: Yes      Need for Family Participation in Patient Care: Yes (Comment) Care giver support system in place?: Yes (comment)   Criminal Activity/Legal Involvement Pertinent to Current Situation/Hospitalization: No - Comment as needed  Activities of Daily Living       Permission Sought/Granted Permission sought to share information with : Family Supports Permission granted to share information with : Yes, Verbal Permission Granted     Permission granted to share info w AGENCY: [place]        Emotional Assessment Appearance:: Appears stated age Attitude/Demeanor/Rapport: Engaged Affect (typically observed): Accepting Orientation: : Oriented to Situation, Oriented to  Time, Oriented to Place, Oriented to Self Alcohol / Substance Use: Not Applicable Psych Involvement: No (comment)  Admission diagnosis:  SOB (shortness of breath) [R06.02] 3-vessel CAD [I25.10] Acute CHF (congestive heart failure) ([place]) [I50.9] Patient Active Problem List   Diagnosis Date Noted  . Chest pain 02/20/2019  . Acute CHF (congestive heart failure) ([place]) 02/19/2019  . 3-vessel CAD   . Shortness of breath   . Coronary artery disease   . Ischemic cardiomyopathy   . Protein-calorie malnutrition, severe 02/05/2019  . Empyema ([place])   . Parapneumonic effusion 02/04/2019   PCP:  Patient, No Pcp Per Pharmacy:   [facility] [address] [place] [address] Phone: [phone] Fax: [phone]  [facility] [place] [place] [address] [place] [address] Phone: [phone] Fax: [phone]     Social Determinants of Health (SDOH) Interventions    Readmission Risk Interventions No flowsheet data found.
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
## 2019-02-21 NOTE — Discharge Instructions (Signed)
Femoral Site Care °This sheet gives you information about how to care for yourself after your procedure. Your health care provider may also give you more specific instructions. If you have problems or questions, contact your health care provider. °What can I expect after the procedure? °After the procedure, it is common to have: °· Bruising that usually fades within 1-2 weeks. °· Tenderness at the site. °Follow these instructions at home: °Wound care °· Follow instructions from your health care provider about how to take care of your insertion site. Make sure you: °? Wash your hands with soap and water before you change your bandage (dressing). If soap and water are not available, use hand sanitizer. °? Change your dressing as told by your health care provider. °? Leave stitches (sutures), skin glue, or adhesive strips in place. These skin closures may need to stay in place for 2 weeks or longer. If adhesive strip edges start to loosen and curl up, you may trim the loose edges. Nevae Pinnix not remove adhesive strips completely unless your health care provider tells you to Navid Lenzen that. °· Celedonio Sortino not take baths, swim, or use a hot tub until your health care provider approves. °· You may shower 24-48 hours after the procedure or as told by your health care provider. °? Gently wash the site with plain soap and water. °? Pat the area dry with a clean towel. °? Kenney Going not rub the site. This may cause bleeding. °· Marianela Mandrell not apply powder or lotion to the site. Keep the site clean and dry. °· Check your femoral site every day for signs of infection. Check for: °? Redness, swelling, or pain. °? Fluid or blood. °? Warmth. °? Pus or a bad smell. °Activity °· For the first 2-3 days after your procedure, or as long as directed: °? Avoid climbing stairs as much as possible. °? Marvene Strohm not squat. °· Candise Crabtree not lift anything that is heavier than 10 lb (4.5 kg), or the limit that you are told, until your health care provider says that it is safe. °· Rest as  directed. °? Avoid sitting for a long time without moving. Get up to take short walks every 1-2 hours. °· Elisia Stepp not drive for 24 hours if you were given a medicine to help you relax (sedative). °General instructions °· Take over-the-counter and prescription medicines only as told by your health care provider. °· Keep all follow-up visits as told by your health care provider. This is important. °Contact a health care provider if you have: °· A fever or chills. °· You have redness, swelling, or pain around your insertion site. °Get help right away if: °· The catheter insertion area swells very fast. °· You pass out. °· You suddenly start to sweat or your skin gets clammy. °· The catheter insertion area is bleeding, and the bleeding does not stop when you hold steady pressure on the area. °· The area near or just beyond the catheter insertion site becomes pale, cool, tingly, or numb. °These symptoms may represent a serious problem that is an emergency. Natesha Hassey not wait to see if the symptoms will go away. Get medical help right away. Call your local emergency services (911 in the [place] Clarie Camey not drive yourself to the hospital. °Summary °· After the procedure, it is common to have bruising that usually fades within 1-2 weeks. °· Check your femoral site every day for signs of infection. °· Evolett Somarriba not lift anything that is heavier than 10 lb (4.5 kg), or the   limit that you are told, until your health care provider says that it is safe. °This information is not intended to replace advice given to you by your health care provider. Make sure you discuss any questions you have with your health care provider. °Document Released: 02/22/2014 Document Revised: 07/04/2017 Document Reviewed: 07/04/2017 °Elsevier Patient Education © 2020 [employer] ° °
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
## 2019-02-21 NOTE — Care Management (Signed)
$  15 of petty cash provided to [facility] pharmacy to cover copays

## 2019-02-21 NOTE — Progress Notes (Signed)
Pt stable, dc home with TOC meds via wheelchair

## 2019-02-22 ENCOUNTER — Other Ambulatory Visit: Payer: Self-pay

## 2019-02-22 ENCOUNTER — Encounter: Payer: Self-pay | Admitting: Adult Health

## 2019-02-22 ENCOUNTER — Ambulatory Visit (INDEPENDENT_AMBULATORY_CARE_PROVIDER_SITE_OTHER): Payer: Self-pay | Admitting: Adult Health

## 2019-02-22 VITALS — HR 76 | Temp 98.3°F | Ht 66.0 in | Wt 117.0 lb

## 2019-02-22 DIAGNOSIS — J869 Pyothorax without fistula: Secondary | ICD-10-CM

## 2019-02-22 DIAGNOSIS — I5021 Acute systolic (congestive) heart failure: Secondary | ICD-10-CM

## 2019-02-22 DIAGNOSIS — R911 Solitary pulmonary nodule: Secondary | ICD-10-CM

## 2019-02-22 DIAGNOSIS — Z720 Tobacco use: Secondary | ICD-10-CM

## 2019-02-22 NOTE — Patient Instructions (Addendum)
CT Chest in 3 weeks .  Great job not smoking .  Mucinex DM Twice daily  As needed  Cough/congestion .  Follow up with Dr. Tamala Julian or Nameer Summer NP in 4 weeks with PFT (COVID 19 prescreen )  Please contact office for sooner follow up if symptoms do not improve or worsen or seek emergency care

## 2019-02-22 NOTE — Progress Notes (Signed)
@Patient  ID: Mark Pittman, male    DOB: 1966-04-18, 53 y.o.   MRN: [id number]  Chief Complaint  Patient presents with   Follow-up    Emphyema     Referring provider: No ref. provider found  HPI: 53 yo male former smoker (02/2019)  Seen for pulmonary consult during hospitalization August 3, 202-  large complex right pleural effusion and empyema. Medical history significant for coronary artery disease, hypertension, congestive heart failure, diabetes  TEST/EVENTS :  CT chest August/ 4 2020- Interval placement of a right-sided chest tube. Significant reduction of pleural effusion seen on the prior chest CT. Some residual loculated. Fluid collection in the right upper lobe. Small amount of residual pleural air. 12 mm superior segment left lower lobe pulmonary nodule on image number 47 of series 4. There is also a small cavitary lesion in the left lower lobe on image number 86 which measures 8 mm. Moderate nodularity along the right major fissure without discrete lesion.   02/22/2019 Follow up : Empyema  Patient returns for a post hospital follow-up.  Patient was admitted earlier this month with shortness of breath and left-sided chest pain.  He was admitted to [facility] for possible community-acquired pneumonia and an NSTEMI.  CT chest showed a complex parapneumonic effusion and patient was transferred to [facility].  There was concern for possible metastatic component as patient had had significant weight loss over the last several months.  And he continues to smoke.  He also had decompensated congestive heart failure.  2D echo showed worsening ejection fraction at 25 to 30%, left ventricular diffuse hypokinesis.  Right and left heart cath showed significant three-vessel coronary obstructive disease.  Normal to minimally increased right heart pressures and ischemic cardiomyopathy.  Due to patient's empyema vascular intervention was placed on hold until more stable.    Patient was seen February 05, 2019 for pulmonary consult found to have a large complex right pleural effusion/empyema.  He required a thoracostomy tube placement with drainage.  2500 cc of purulent fluid was drained from the right pleural space.  Placed on aggressive antibiotics with Unasyn and Augmentin.  Pleural cultures PREVOTELLA SPECIES  BETA LACTAMASE POSITIVE  patient was discharged on Augmentin for an additional week of therapy.  Patient returned back to the emergency room August 17 with worsening chest pain.  Underwent repeat cath with drug-eluting stents were placed.  Patient was discharged yesterday.  Patient says he is doing okay.  He says he feels very weak.  He denies any chest pain.  Patient says breathing is starting to get slightly better.  He denies any cough or congestion. He does not carry a diagnosis of COPD or Asthma .  He denies any cough or wheezing.  Has had 40 pound weight loss over the last 6 months He says he has finished his antibiotics.  Chest x-ray on February 20, 2019 showed small pleural effusions pleural opacity along the right base.  Pleural gas is no longer seen   Hx  Says he is disabled from congestive heart failure. Previously a landscaper Smoked 1 ppd x 30 yr  etoh abuse hx - quit 2016 .  Drug hx cocaine , none since 1999.      No Known Allergies   There is no immunization history on file for this patient.  Past Medical History:  Diagnosis Date   CHF (congestive heart failure), NYHA class I ([place]) 2005   patient   Diabetes ([place]) 2018   Patient  MI (myocardial infarction) [facility](HCC) 2015   Patient   Smoker 1987   Source    Tobacco History: Social History   Tobacco Use  Smoking Status Former Smoker   Packs/day: 0.50   Years: 30.00   Pack years: 15.00   Quit date: 02/02/2019   Years since quitting: 0.0  Smokeless Tobacco Never Used  Tobacco Comment   Patient states he quit, but he will take nicotine patch   Counseling given: Not  Answered Comment: Patient states he quit, but he will take nicotine patch   Outpatient Medications Prior to Visit  Medication Sig Dispense Refill   aspirin 81 MG chewable tablet Chew 1 tablet (81 mg total) by mouth daily. 30 tablet 0   atorvastatin (LIPITOR) 40 MG tablet Take 1 tablet (40 mg total) by mouth daily at 6 PM. 30 tablet 0   clonazePAM (KLONOPIN) 1 MG tablet Take 1 tablet (1 mg total) by mouth 2 (two) times daily as needed for anxiety. 30 tablet 0   clopidogrel (PLAVIX) 75 MG tablet Take 1 tablet (75 mg total) by mouth daily. 90 tablet 3   furosemide (LASIX) 20 MG tablet Take 1 tablet (20 mg total) by mouth daily. 30 tablet 0   glipiZIDE (GLUCOTROL) 5 MG tablet Take 1 tablet (5 mg total) by mouth 2 (two) times daily. 60 tablet 11   isosorbide mononitrate (IMDUR) 30 MG 24 hr tablet Take 1 tablet (30 mg total) by mouth daily. 30 tablet 0   losartan (COZAAR) 50 MG tablet Take 1 tablet (50 mg total) by mouth daily. 30 tablet 0   metFORMIN (GLUCOPHAGE) 500 MG tablet Take 1 tablet (500 mg total) by mouth 2 (two) times daily with a meal. 60 tablet 0   metoprolol succinate (TOPROL-XL) 100 MG 24 hr tablet Take 1 tablet (100 mg total) by mouth daily. Take with or immediately following a meal. 30 tablet 0   nitroGLYCERIN (NITROSTAT) 0.4 MG SL tablet Place 0.4 mg under the tongue every 5 (five) minutes as needed for chest pain.     No facility-administered medications prior to visit.      Review of Systems:   Constitutional:   No  weight loss, night sweats,  Fevers, chills,  +fatigue, or  lassitude.  HEENT:   No headaches,  Difficulty swallowing,  Tooth/dental problems, or  Sore throat,                No sneezing, itching, ear ache, nasal congestion, post nasal drip,   CV:  No chest pain,  Orthopnea, PND, swelling in lower extremities, anasarca, dizziness, palpitations, syncope.   GI  No heartburn, indigestion, abdominal pain, nausea, vomiting, diarrhea, change in bowel  habits, loss of appetite, bloody stools.   Resp:   No excess mucus, no productive cough,  No non-productive cough,  No coughing up of blood.  No change in color of mucus.  No wheezing.  No chest wall deformity  Skin: no rash or lesions.  GU: no dysuria, change in color of urine, no urgency or frequency.  No flank pain, no hematuria   MS:  No joint pain or swelling.  No decreased range of motion.  No back pain.    Physical Exam  Pulse 76    Temp 98.3 F (36.8 C) (Oral)    Ht 5\' 6"  (1.676 m)    Wt 117 lb (53.1 kg)    SpO2 96%    BMI 18.88 kg/m   GEN: A/Ox3; pleasant , NAD, frail-appearing,  thin   HEENT:  [place]/AT,  , NOSE-clear, THROAT-clear, no lesions, no postnasal drip or exudate noted.  Edentulous  NECK:  Supple w/ fair ROM; no JVD; normal carotid impulses w/o bruits; no thyromegaly or nodules palpated; no lymphadenopathy.    RESP diminished breath sounds in the bases no accessory muscle use, no dullness to percussion  CARD:  RRR, no m/r/g, no peripheral edema, pulses intact, no cyanosis or clubbing.  GI:   Soft & nt; nml bowel sounds; no organomegaly or masses detected.   Musco: Warm bil, no deformities or joint swelling noted.   Neuro: alert, no focal deficits noted.    Skin: Warm, no lesions or rashes    Lab Results:  CBC    Component Value Date/Time   WBC 11.3 (H) 02/21/2019 0329   RBC 4.15 (L) 02/21/2019 0329   HGB 12.9 (L) 02/21/2019 0329   HCT 38.6 (L) 02/21/2019 0329   PLT 335 02/21/2019 0329   MCV 93.0 02/21/2019 0329   MCH 31.1 02/21/2019 0329   MCHC 33.4 02/21/2019 0329   RDW 14.9 02/21/2019 0329   LYMPHSABS 1.4 02/20/2019 0400   MONOABS 1.0 02/20/2019 0400   EOSABS 0.2 02/20/2019 0400   BASOSABS 0.1 02/20/2019 0400    BMET    Component Value Date/Time   NA 139 02/21/2019 0329   K 3.6 02/21/2019 0329   CL 102 02/21/2019 0329   CO2 27 02/21/2019 0329   GLUCOSE 139 (H) 02/21/2019 0329   BUN 15 02/21/2019 0329   CREATININE 0.92 02/21/2019 0329    CALCIUM 8.6 (L) 02/21/2019 0329   GFRNONAA >60 02/21/2019 0329   GFRAA >60 02/21/2019 0329    BNP    Component Value Date/Time   BNP >4,500.0 (H) 02/19/2019 1617    ProBNP No results found for: PROBNP  Imaging: Dg Chest 1 View  Result Date: 02/05/2019 CLINICAL DATA:  Parapneumonic effusion. Right chest tube in place. EXAM: CHEST  1 VIEW COMPARISON:  Chest x-ray dated 02/02/2019 and chest CT dated 02/04/2019 FINDINGS: Right chest tube in place. No pneumothorax. Improved aeration at the right lung base with some residual atelectasis and lung haziness, probably due to re-expansion edema. Chronic cardiomegaly. Pulmonary vascularity is normal. Left lung is clear. Left effusion noted on the prior CT scan is not apparent on this chest x-ray. IMPRESSION: 1. No pneumothorax.  Right chest tube in place. 2. Improved aeration at the right lung base. Electronically Signed   By: Francene Boyers M.D.   On: 02/05/2019 12:23   Dg Chest 2 View  Result Date: 02/20/2019 CLINICAL DATA:  Shortness of breath EXAM: CHEST - 2 VIEW COMPARISON:  Yesterday FINDINGS: Chronic cardiomegaly. Pulmonary and pleural opacity at the right base where there is pleural thickening/fluid. Trace left pleural effusion. Pleural gas is no longer clearly seen. No new abnormality. IMPRESSION: Cardiomegaly and small pleural effusions. Pleural gas is not definitely seen today. Electronically Signed   By: Marnee Spring M.D.   On: 02/20/2019 07:29   Dg Chest 2 View  Result Date: 02/19/2019 CLINICAL DATA:  Chest pain EXAM: CHEST - 2 VIEW COMPARISON:  02/06/2019 FINDINGS: Stable cardiomegaly. Aortic calcified and tortuous. Interval removal of a right-sided chest tube. A small loculated air and fluid collection at the lateral right lung base persists, compatible with a small residual hydropneumothorax. This has decreased in size compared to the prior study. Small loculated left pleural effusion. The left lung is otherwise clear. IMPRESSION:  1. Interval removal of right-sided chest tube. Small  loculated hydropneumothorax persists at the lateral aspect of the right lung base, decreased in size from prior. 2. Small loculated left pleural effusion. Electronically Signed   By: Duanne GuessNicholas  Plundo M.D.   On: 02/19/2019 17:00   Ct Chest Wo Contrast  Result Date: 02/06/2019 CLINICAL DATA:  Chest tube placement. EXAM: CT CHEST WITHOUT CONTRAST TECHNIQUE: Multidetector CT imaging of the chest was performed following the standard protocol without IV contrast. COMPARISON:  CT chest 02/04/2019 FINDINGS: Cardiovascular: The heart is within normal limits in size and stable. No pericardial effusion. There is tortuosity and calcification of the thoracic aorta and age advanced fairly extensive coronary artery calcifications. Mediastinum/Nodes: Persistent borderline enlarged mediastinal and hilar lymph nodes. Lungs/Pleura: Interval placement of a right-sided chest tube. Significant reduction of pleural effusion seen on the prior chest CT. Some residual loculated. Fluid collection in the right upper lobe. Small amount of residual pleural air. 12 mm superior segment left lower lobe pulmonary nodule on image number 47 of series 4. There is also a small cavitary lesion in the left lower lobe on image number 86 which measures 8 mm. Moderate nodularity along the right major fissure without discrete lesion. I do not see any obvious right lung mass or worrisome right lung nodules. Patchy areas of subpleural atelectasis. Small left pleural effusion with overlying atelectasis. Upper Abdomen: No significant upper abdominal findings. Advanced vascular calcifications. No significant bony findings. Musculoskeletal: No chest wall mass, supraclavicular or axillary adenopathy. Body wall edema is noted. No significant bony findings. IMPRESSION: 1. Right-sided chest tube in place with near complete evacuation of the large pleural fluid collection. Areas of right lower lobe atelectasis  persist. 2. Small left pleural effusion with overlying atelectasis. 3. 2 left lower lobe pulmonary lesions as detailed above. PET-CT suggested for further evaluation. 4. Stable borderline enlarged mediastinal and hilar lymph nodes. 5. Diffuse body wall edema and upper abdominal mesenteric edema. 6. Stable age advanced three-vessel coronary artery calcifications and aortic calcifications. Aortic Atherosclerosis (ICD10-I70.0). Electronically Signed   By: Rudie MeyerP.  Gallerani M.D.   On: 02/06/2019 13:19   Dg Chest Port 1 View  Result Date: 02/06/2019 CLINICAL DATA:  53 year old male status post chest tube placement yesterday for empyema. EXAM: PORTABLE CHEST 1 VIEW COMPARISON:  02/05/2019 chest radiographs, [facility]. FINDINGS: Portable AP upright view at 0625 hours. Stable right chest tube located at the right lung base. No pneumothorax. Veiling opacity in the right mid and lower lung compatible with residual fluid, which might be within the fissure. Additional patchy atelectasis or scarring at the right lung base. Underlying cardiomegaly. Stable cardiac size and mediastinal contours. Stable left lung. Visualized tracheal air column is within normal limits. Negative visible bowel gas pattern. No acute osseous abnormality identified. IMPRESSION: 1. Stable right chest tube. No pneumothorax. 2. Residual right pleural effusion, some may be loculated and/or within the fissure. 3. Cardiomegaly. Electronically Signed   By: Odessa FlemingH  Hall M.D.   On: 02/06/2019 08:06      No flowsheet data found.  No results found for: NITRICOXIDE      Assessment & Plan:   No problem-specific Assessment & Plan notes found for this encounter.     Rubye Oaksammy Kimmi Acocella, NP 02/22/2019

## 2019-02-22 NOTE — Discharge Summary (Signed)
Physician Discharge Summary  Rosalita ChessmanJeffrey Mase YQM:[id number] DOB: 03/17/1966 DOA: 02/19/2019  PCP: Patient, No Pcp Per  Admit date: 02/19/2019 Discharge date: 02/22/2019  Recommendations for Outpatient Follow-up:  1. Follow up with PCP in 7-10 days. 2. PT will need to have follow CT/PET of chest to further investigate pulmonary nodule and cavitary lesion seen in the left lung on CT 02/06/2019. 3. Follow up with cardiac rehab as directed. 4. Follow up with cardiology as directed.  Follow-up Information    Marcine MatarJohnson, Deborah B, MD Follow up on 03/05/2019.   Specialty: Internal Medicine Why: @ 9:30 for hospital follow up appointment. If you cannot make this scheduled appointment- please call the office to reschedule. Onsite Pharmacy at this location meds range from $4.00-$10.00.  Contact information: [address] [place] [address] [place] [address] [phone]        Leone BrandIngold, Laura R, NP Follow up on 03/08/2019.   Specialties: Cardiology, Radiology Why: at 8:15am for your follow up appt.  Contact information: [address] [place] [address] [phone]            Discharge Diagnoses: Principal diagnosis is #1 1. Chest Pain 2. Acute CHF  3. 3-vessel CAD 4. Empyema  Discharge Condition: Fair Disposition: Home  Diet recommendation: Heart Healthy, Carbohydrate modified.  Filed Weights   02/19/19 1609 02/21/19 0449  Weight: 53.5 kg 49.9 kg    History of present illness:  Day zero note -please refer to H/P from earlier today for complete history; briefly patient is a readmission for chest pain after being discharged some 2 weeks ago with empyema that required chest tube placement.  Previous cath at that time did show triple-vessel disease with planned future intervention to allow resolution of infection. Given worsening symptoms, findings in the ER including echo showing EF of 25 to 30% cardiology was consulted and pending cardiac catheterization later today.  Hospital  Course:  The patient was admitted to a telemetry bed. Cardiology was consulted. He was ruled out for MI by EKG and enzyme criteria. He underwent LHC on 02/20/2019.  It revealed:   Prox LAD to Mid LAD lesion is 20% stenosed.  2nd Diag lesion is 70% stenosed.  Mid LAD-1 lesion is 20% stenosed.  Mid LAD-2 lesion is 80% stenosed.  A drug-eluting stent was successfully placed using a STENT SYNERGY DES 2.5X38.  Post intervention, there is a 0% residual stenosis.  2nd Mrg lesion is 100% stenosed. 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  A drug-eluting stent was successfully placed using a STENT SYNERGY DES 2.25X24.  Post intervention, there is a 0% residual stenosis.  Moderate RCA disease. Severe distal branch vessel disease. Not amenable to PCI.  RPDA lesion is 70% stenosed.  2nd RPL lesion is 80% stenosed.  LV end diastolic pressure is normal. There is no aortic valve stenosis. Today's assessment: S: the patient is resting comfortably. No new complaints. O: Vitals:  Vitals:   02/21/19 1043 02/21/19 1238  BP:  (!) 156/114  Pulse:  79  Resp: 19   Temp:    SpO2:      Constitutional:  He is awake, alert, and oriented x 3. No acute distress. Respiratory:   No increased work of breathing.  No wheezes, rales, or rhonchi.   No tactile fremitus. Cardiovascular:   Regular rate and rhythm.  No murmurs, ectopy, or gallups.  No lateral PMI. No thrills. Abdomen:   Abdomen is soft, non-tender, non-distended.  No hernias, masses, or  organomegaly  Normoactive bowel sounds. Musculoskeletal:   No cyanosis, clubbing, or edema. Skin:   No rashes, lesions, ulcers  palpation of skin: no induration or nodules Neurologic:   CN 2-12 intact  Sensation all 4 extremities intact Psychiatric:   judgement and insight appear normal  Mental status o Mood, affect appropriate o Orientation to person, place, time   Discharge  Instructions  Discharge Instructions    (HEART FAILURE PATIENTS) Call MD:  Anytime you have any of the following symptoms: 1) 3 pound weight gain in 24 hours or 5 pounds in 1 week 2) shortness of breath, with or without a dry hacking cough 3) swelling in the hands, feet or stomach 4) if you have to sleep on extra pillows at night in order to breathe.   Complete by: As directed    Amb Referral to Cardiac Rehabilitation   Complete by: As directed    Referring to [place] CRP 2   Diagnosis: Coronary Stents   After initial evaluation and assessments completed: Virtual Based Care may be provided alone or in conjunction with Phase 2 Cardiac Rehab based on patient barriers.: Yes   Call MD for:  difficulty breathing, headache or visual disturbances   Complete by: As directed    Call MD for:  extreme fatigue   Complete by: As directed    Call MD for:  persistant dizziness or light-headedness   Complete by: As directed    Diet - low sodium heart healthy   Complete by: As directed    Diet Carb Modified   Complete by: As directed    Discharge instructions   Complete by: As directed    Follow up with PCP in 7-10 days. PT will need to have follow CT/PET of chest to further investigate pulmonary nodule and cavitary lesion seen in the left lung on CT 02/06/2019. Follow up with cardiac rehab as directed. Follow up with cardiology as directed. Have chemistry checked on 02/26/2019 and reported to PCP.   Heart Failure patients record your daily weight using the same scale at the same time of day   Complete by: As directed    Increase activity slowly   Complete by: As directed    STOP any activity that causes chest pain, shortness of breath, dizziness, sweating, or exessive weakness   Complete by: As directed      Allergies as of 02/21/2019   No Known Allergies     Medication List    STOP taking these medications   lisinopril 20 MG tablet Commonly known as: ZESTRIL   traMADol 50 MG  tablet Commonly known as: Ultram     TAKE these medications   aspirin 81 MG chewable tablet Chew 1 tablet (81 mg total) by mouth daily.   atorvastatin 40 MG tablet Commonly known as: LIPITOR Take 1 tablet (40 mg total) by mouth daily at 6 PM.   clonazePAM 1 MG tablet Commonly known as: KLONOPIN Take 1 tablet (1 mg total) by mouth 2 (two) times daily as needed for anxiety.   clopidogrel 75 MG tablet Commonly known as: PLAVIX Take 1 tablet (75 mg total) by mouth daily.   furosemide 20 MG tablet Commonly known as: LASIX Take 1 tablet (20 mg total) by mouth daily.   glipiZIDE 5 MG tablet Commonly known as: GLUCOTROL Take 1 tablet (5 mg total) by mouth 2 (two) times daily.   isosorbide mononitrate 30 MG 24 hr tablet Commonly known as: IMDUR Take 1 tablet (30 mg total) by  mouth daily.   losartan 50 MG tablet Commonly known as: COZAAR Take 1 tablet (50 mg total) by mouth daily.   metFORMIN 500 MG tablet Commonly known as: GLUCOPHAGE Take 1 tablet (500 mg total) by mouth 2 (two) times daily with a meal.   metoprolol succinate 100 MG 24 hr tablet Commonly known as: TOPROL-XL Take 1 tablet (100 mg total) by mouth daily. Take with or immediately following a meal.   nitroGLYCERIN 0.4 MG SL tablet Commonly known as: NITROSTAT Place 0.4 mg under the tongue every 5 (five) minutes as needed for chest pain.      No Known Allergies  The results of significant diagnostics from this hospitalization (including imaging, microbiology, ancillary and laboratory) are listed below for reference.    Significant Diagnostic Studies: Dg Chest 1 View  Result Date: 02/05/2019 CLINICAL DATA:  Parapneumonic effusion. Right chest tube in place. EXAM: CHEST  1 VIEW COMPARISON:  Chest x-ray dated 02/02/2019 and chest CT dated 02/04/2019 FINDINGS: Right chest tube in place. No pneumothorax. Improved aeration at the right lung base with some residual atelectasis and lung haziness, probably due to  re-expansion edema. Chronic cardiomegaly. Pulmonary vascularity is normal. Left lung is clear. Left effusion noted on the prior CT scan is not apparent on this chest x-ray. IMPRESSION: 1. No pneumothorax.  Right chest tube in place. 2. Improved aeration at the right lung base. Electronically Signed   By: Francene Boyers M.D.   On: 02/05/2019 12:23   Dg Chest 2 View  Result Date: 02/20/2019 CLINICAL DATA:  Shortness of breath EXAM: CHEST - 2 VIEW COMPARISON:  Yesterday FINDINGS: Chronic cardiomegaly. Pulmonary and pleural opacity at the right base where there is pleural thickening/fluid. Trace left pleural effusion. Pleural gas is no longer clearly seen. No new abnormality. IMPRESSION: Cardiomegaly and small pleural effusions. Pleural gas is not definitely seen today. Electronically Signed   By: Marnee Spring M.D.   On: 02/20/2019 07:29   Dg Chest 2 View  Result Date: 02/19/2019 CLINICAL DATA:  Chest pain EXAM: CHEST - 2 VIEW COMPARISON:  02/06/2019 FINDINGS: Stable cardiomegaly. Aortic calcified and tortuous. Interval removal of a right-sided chest tube. A small loculated air and fluid collection at the lateral right lung base persists, compatible with a small residual hydropneumothorax. This has decreased in size compared to the prior study. Small loculated left pleural effusion. The left lung is otherwise clear. IMPRESSION: 1. Interval removal of right-sided chest tube. Small loculated hydropneumothorax persists at the lateral aspect of the right lung base, decreased in size from prior. 2. Small loculated left pleural effusion. Electronically Signed   By: Duanne Guess M.D.   On: 02/19/2019 17:00   Ct Chest Wo Contrast  Result Date: 02/06/2019 CLINICAL DATA:  Chest tube placement. EXAM: CT CHEST WITHOUT CONTRAST TECHNIQUE: Multidetector CT imaging of the chest was performed following the standard protocol without IV contrast. COMPARISON:  CT chest 02/04/2019 FINDINGS: Cardiovascular: The heart is  within normal limits in size and stable. No pericardial effusion. There is tortuosity and calcification of the thoracic aorta and age advanced fairly extensive coronary artery calcifications. Mediastinum/Nodes: Persistent borderline enlarged mediastinal and hilar lymph nodes. Lungs/Pleura: Interval placement of a right-sided chest tube. Significant reduction of pleural effusion seen on the prior chest CT. Some residual loculated. Fluid collection in the right upper lobe. Small amount of residual pleural air. 12 mm superior segment left lower lobe pulmonary nodule on image number 47 of series 4. There is also a small cavitary  lesion in the left lower lobe on image number 86 which measures 8 mm. Moderate nodularity along the right major fissure without discrete lesion. I do not see any obvious right lung mass or worrisome right lung nodules. Patchy areas of subpleural atelectasis. Small left pleural effusion with overlying atelectasis. Upper Abdomen: No significant upper abdominal findings. Advanced vascular calcifications. No significant bony findings. Musculoskeletal: No chest wall mass, supraclavicular or axillary adenopathy. Body wall edema is noted. No significant bony findings. IMPRESSION: 1. Right-sided chest tube in place with near complete evacuation of the large pleural fluid collection. Areas of right lower lobe atelectasis persist. 2. Small left pleural effusion with overlying atelectasis. 3. 2 left lower lobe pulmonary lesions as detailed above. PET-CT suggested for further evaluation. 4. Stable borderline enlarged mediastinal and hilar lymph nodes. 5. Diffuse body wall edema and upper abdominal mesenteric edema. 6. Stable age advanced three-vessel coronary artery calcifications and aortic calcifications. Aortic Atherosclerosis (ICD10-I70.0). Electronically Signed   By: Rudie Meyer M.D.   On: 02/06/2019 13:19   Dg Chest Port 1 View  Result Date: 02/06/2019 CLINICAL DATA:  53 year old male status post  chest tube placement yesterday for empyema. EXAM: PORTABLE CHEST 1 VIEW COMPARISON:  02/05/2019 chest radiographs, and earlier including [facility] chest CT 02/04/2019. FINDINGS: Portable AP upright view at 0625 hours. Stable right chest tube located at the right lung base. No pneumothorax. Veiling opacity in the right mid and lower lung compatible with residual fluid, which might be within the fissure. Additional patchy atelectasis or scarring at the right lung base. Underlying cardiomegaly. Stable cardiac size and mediastinal contours. Stable left lung. Visualized tracheal air column is within normal limits. Negative visible bowel gas pattern. No acute osseous abnormality identified. IMPRESSION: 1. Stable right chest tube. No pneumothorax. 2. Residual right pleural effusion, some may be loculated and/or within the fissure. 3. Cardiomegaly. Electronically Signed   By: Odessa Fleming M.D.   On: 02/06/2019 08:06    Microbiology: Recent Results (from the past 240 hour(s))  SARS CORONAVIRUS 2 Nasal Swab Aptima Multi Swab     Status: None   Collection Time: 02/16/19  1:27 PM   Specimen: Aptima Multi Swab; Nasal Swab  Result Value Ref Range Status   SARS Coronavirus 2 NEGATIVE NEGATIVE Final    Comment: (NOTE) SARS-CoV-2 target nucleic acids are NOT DETECTED. The SARS-CoV-2 RNA is generally detectable in upper and lower respiratory specimens during the acute phase of infection. Negative results do not preclude SARS-CoV-2 infection, do not rule out co-infections with other pathogens, and should not be used as the sole basis for treatment or other patient management decisions. Negative results must be combined with clinical observations, patient history, and epidemiological information. The expected result is Negative. Fact Sheet for Patients: HairSlick.no Fact Sheet for Healthcare Providers: quierodirigir.com This test is not yet approved or  cleared by the [place] FDA and  has been authorized for detection and/or diagnosis of SARS-CoV-2 by FDA under an Emergency Use Authorization (EUA). This EUA will remain  in effect (meaning this test can be used) for the duration of the COVID-19 declaration under Section 56 4(b)(1) of the Act, 21 U.S.C. section 360bbb-3(b)(1), unless the authorization is terminated or revoked sooner. Performed at [facility], [address], [place], [place] [address]   SARS Coronavirus 2 [facility] order, Performed in [facility] hospital lab) Nasopharyngeal Nasopharyngeal Swab     Status: None   Collection Time: 02/19/19  5:40 PM   Specimen: Nasopharyngeal Swab  Result Value Ref Range Status   SARS Coronavirus 2 NEGATIVE NEGATIVE Final    Comment: (NOTE) If result is NEGATIVE SARS-CoV-2 target nucleic acids are NOT DETECTED. The SARS-CoV-2 RNA is generally detectable in upper and lower  respiratory specimens during the acute phase of infection. The lowest  concentration of SARS-CoV-2 viral copies this assay can detect is 250  copies / mL. A negative result does not preclude SARS-CoV-2 infection  and should not be used as the sole basis for treatment or other  patient management decisions.  A negative result may occur with  improper specimen collection / handling, submission of specimen other  than nasopharyngeal swab, presence of viral mutation(s) within the  areas targeted by this assay, and inadequate number of viral copies  (<250 copies / mL). A negative result must be combined with clinical  observations, patient history, and epidemiological information. If result is POSITIVE SARS-CoV-2 target nucleic acids are DETECTED. The SARS-CoV-2 RNA is generally detectable in upper and lower  respiratory specimens dur ing the acute phase of infection.  Positive  results are indicative of active infection with SARS-CoV-2.  Clinical  correlation with patient history and other diagnostic  information is  necessary to determine patient infection status.  Positive results do  not rule out bacterial infection or co-infection with other viruses. If result is PRESUMPTIVE POSTIVE SARS-CoV-2 nucleic acids MAY BE PRESENT.   A presumptive positive result was obtained on the submitted specimen  and confirmed on repeat testing.  While 2019 novel coronavirus  (SARS-CoV-2) nucleic acids may be present in the submitted sample  additional confirmatory testing may be necessary for epidemiological  and / or clinical management purposes  to differentiate between  SARS-CoV-2 and other Sarbecovirus currently known to infect humans.  If clinically indicated additional testing with an alternate test  methodology [phone]) is advised. The SARS-CoV-2 RNA is generally  detectable in upper and lower respiratory sp ecimens during the acute  phase of infection. The expected result is Negative. Fact Sheet for Patients:  BoilerBrush.com.cy Fact Sheet for Healthcare Providers: https://pope.com/ This test is not yet approved or cleared by the [place] FDA and has been authorized for detection and/or diagnosis of SARS-CoV-2 by FDA under an Emergency Use Authorization (EUA).  This EUA will remain in effect (meaning this test can be used) for the duration of the COVID-19 declaration under Section 564(b)(1) of the Act, 21 U.S.C. section 360bbb-3(b)(1), unless the authorization is terminated or revoked sooner. Performed at [facility], [address] [place], [place] [address]      Labs: Basic Metabolic Panel: Recent Labs  Lab 02/19/19 1617 02/20/19 0400 02/21/19 0329  NA 136 140 139  K 4.2 3.0* 3.6  CL 102 101 102  CO2 GLUCOSE 99 122* 139*  BUN CREATININE 0.87 0.85 0.92  CALCIUM 8.7* 8.9 8.6*   Liver Function Tests: Recent Labs  Lab 02/20/19 0400  AST 104*  ALT 180*  ALKPHOS 103  BILITOT 0.9  PROT 7.3   ALBUMIN 2.6*   No results for input(s): LIPASE, AMYLASE in the last 168 hours. No results for input(s): AMMONIA in the last 168 hours. CBC: Recent Labs  Lab 02/19/19 1617 02/20/19 0400 02/21/19 0329  WBC 12.5* 12.4* 11.3*  NEUTROABS  --  9.7*  --   HGB 12.7* 13.7 12.9*  HCT 38.9* 40.5 38.6*  MCV 96.0 92.7 93.0  PLT 359 369 335   Cardiac Enzymes: No results  for input(s): CKTOTAL, CKMB, CKMBINDEX, TROPONINI in the last 168 hours. BNP: BNP (last 3 results) Recent Labs    02/04/19 2119 02/19/19 1617  BNP 1,550.4* >4,500.0*    ProBNP (last 3 results) No results for input(s): PROBNP in the last 8760 hours.  CBG: Recent Labs  Lab 02/20/19 2041 02/21/19 0005 02/21/19 0453 02/21/19 0753 02/21/19 1236  GLUCAP 276* 168* 151* 266* 169*    Active Problems:   Empyema (HCC)   Coronary artery disease   Acute CHF (congestive heart failure) (HCC)   3-vessel CAD   Chest pain   Time coordinating discharge: 38 minutes.  Signed:        Jagar Lua, DO [facility]  02/22/2019, 8:41 AM

## 2019-02-23 DIAGNOSIS — R911 Solitary pulmonary nodule: Secondary | ICD-10-CM | POA: Insufficient documentation

## 2019-02-23 DIAGNOSIS — Z720 Tobacco use: Secondary | ICD-10-CM | POA: Insufficient documentation

## 2019-02-23 NOTE — Assessment & Plan Note (Signed)
Right-sided empyema status post thoracoscopy with drainage.  Patient has completed an aggressive course of antibiotics.  We will follow-up CT chest in few weeks.  Plan  Patient Instructions  CT Chest in 3 weeks .  Great job not smoking .  Mucinex DM Twice daily  As needed  Cough/congestion .  Follow up with Dr. Tamala Julian or Capitola Ladson NP in 4 weeks with PFT (COVID 19 prescreen )  Please contact office for sooner follow up if symptoms do not improve or worsen or seek emergency care

## 2019-02-23 NOTE — Assessment & Plan Note (Signed)
Ongoing smoking cessation is encouraged.  Will check PFTs on return to see if of underlying COPD or emphysema is present

## 2019-02-23 NOTE — Assessment & Plan Note (Signed)
CHF and coronary disease.  Patient is to follow-up with cardiology as planned

## 2019-02-23 NOTE — Assessment & Plan Note (Signed)
Lung nodules and nodularity noted on CT chest in the setting of empyema.  Patient is completed a full course of antibiotics and is clinically improving.  Will allow an additional few weeks and recheck CT chest.  If nodules are remain present in the setting of active smoker and weight loss will need to proceed with PET scan.  Plan  Patient Instructions  CT Chest in 3 weeks .  Great job not smoking .  Mucinex DM Twice daily  As needed  Cough/congestion .  Follow up with Dr. Tamala Julian or [place] NP in 4 weeks with PFT (COVID 19 prescreen )  Please contact office for sooner follow up if symptoms do not improve or worsen or seek emergency care

## 2019-03-05 ENCOUNTER — Ambulatory Visit: Payer: Self-pay | Attending: Internal Medicine | Admitting: Internal Medicine

## 2019-03-05 ENCOUNTER — Encounter: Payer: Self-pay | Admitting: Internal Medicine

## 2019-03-05 ENCOUNTER — Other Ambulatory Visit: Payer: Self-pay

## 2019-03-05 VITALS — BP 185/137 | HR 97 | Temp 97.5°F | Resp 16 | Ht 65.0 in | Wt 120.0 lb

## 2019-03-05 DIAGNOSIS — I2510 Atherosclerotic heart disease of native coronary artery without angina pectoris: Secondary | ICD-10-CM

## 2019-03-05 DIAGNOSIS — F419 Anxiety disorder, unspecified: Secondary | ICD-10-CM

## 2019-03-05 DIAGNOSIS — IMO0002 Reserved for concepts with insufficient information to code with codable children: Secondary | ICD-10-CM

## 2019-03-05 DIAGNOSIS — E1165 Type 2 diabetes mellitus with hyperglycemia: Secondary | ICD-10-CM

## 2019-03-05 DIAGNOSIS — R634 Abnormal weight loss: Secondary | ICD-10-CM

## 2019-03-05 DIAGNOSIS — Z09 Encounter for follow-up examination after completed treatment for conditions other than malignant neoplasm: Secondary | ICD-10-CM

## 2019-03-05 DIAGNOSIS — Z2821 Immunization not carried out because of patient refusal: Secondary | ICD-10-CM

## 2019-03-05 DIAGNOSIS — R918 Other nonspecific abnormal finding of lung field: Secondary | ICD-10-CM

## 2019-03-05 DIAGNOSIS — F17200 Nicotine dependence, unspecified, uncomplicated: Secondary | ICD-10-CM

## 2019-03-05 DIAGNOSIS — I5022 Chronic systolic (congestive) heart failure: Secondary | ICD-10-CM

## 2019-03-05 DIAGNOSIS — Z1331 Encounter for screening for depression: Secondary | ICD-10-CM

## 2019-03-05 DIAGNOSIS — I10 Essential (primary) hypertension: Secondary | ICD-10-CM

## 2019-03-05 MED ORDER — LOSARTAN POTASSIUM 100 MG PO TABS: 100.0000 mg | ORAL_TABLET | Freq: Every day | ORAL | 6 refills | Status: AC

## 2019-03-05 MED ORDER — BUSPIRONE HCL 5 MG PO TABS: 5.0000 mg | ORAL_TABLET | Freq: Two times a day (BID) | ORAL | 2 refills | Status: DC

## 2019-03-05 MED ORDER — METOPROLOL SUCCINATE ER 100 MG PO TB24: 100.0000 mg | ORAL_TABLET | Freq: Every day | ORAL | 6 refills | Status: AC

## 2019-03-05 MED ORDER — ASPIRIN 81 MG PO CHEW: 81.0000 mg | CHEWABLE_TABLET | Freq: Every day | ORAL | 1 refills | Status: AC

## 2019-03-05 MED ORDER — ATORVASTATIN CALCIUM 40 MG PO TABS: 40.0000 mg | ORAL_TABLET | Freq: Every day | ORAL | 6 refills | Status: AC

## 2019-03-05 MED ORDER — FUROSEMIDE 20 MG PO TABS: 20.0000 mg | ORAL_TABLET | Freq: Every day | ORAL | 6 refills | Status: DC

## 2019-03-05 MED ORDER — ISOSORBIDE MONONITRATE ER 30 MG PO TB24: 30.0000 mg | ORAL_TABLET | Freq: Every day | ORAL | 6 refills | Status: AC

## 2019-03-05 MED ORDER — CLOPIDOGREL BISULFATE 75 MG PO TABS: 75.0000 mg | ORAL_TABLET | Freq: Every day | ORAL | 3 refills | Status: AC

## 2019-03-05 MED ORDER — METFORMIN HCL 500 MG PO TABS: 500.0000 mg | ORAL_TABLET | Freq: Two times a day (BID) | ORAL | 6 refills | Status: DC

## 2019-03-05 MED ORDER — GLIPIZIDE 5 MG PO TABS: 5.0000 mg | ORAL_TABLET | Freq: Two times a day (BID) | ORAL | 11 refills | Status: AC

## 2019-03-05 MED FILL — busPIRone HCL 5 MG TABS: 5 | 30 days supply | Qty: 60 | Fill #0

## 2019-03-05 MED FILL — glipiZIDE 5 MG TABS: 5 | 30 days supply | Qty: 60 | Fill #0

## 2019-03-05 NOTE — Patient Instructions (Signed)
Give patient a follow-up appointment with the clinical pharmacist in 2 weeks for repeat blood pressure check.  Increase losartan to 100 mg daily. Increase furosemide to 2 tablets daily for the next 3 days then go back to taking 1 tablet daily. Try to check your blood pressure at least 2-3 times a week.  Goal is 130/80 or lower.  We have started you on a medication called BuSpar to take twice a day for anxiety.

## 2019-03-05 NOTE — Progress Notes (Signed)
[id number]  Pt states he has issues with his bladder

## 2019-03-05 NOTE — Progress Notes (Signed)
Patient ID: Mark Pittman, male    DOB: 07/01/1966  MRN: [id number]  CC: Hospitalization Follow-up   Subjective: Mark Pittman is a 53 y.o. male who presents for new pt visit and hosp f/u His concerns today include:  Hx of CAD, HTN, CHF, DM, tob dep, anxiety disorder  Previous PCP was at [facility] [place].  His PCP left and he never went back to be seen by another provider there.  Last seen 8-9 mths ago.,  pt reports he was taking metoprolol, Norvasc, HCTZ and Lisinopril.  He was not taking Metformin consistently.    Patient hospitalized 8/2-12/2018 with right-sided parapneumonic effusion/empyema requiring chest tube placement for drainage.  Patient treated appropriately with antibiotics.  Cultures positive for Prevotella species, beta-lactamase positive.  CT of the chest also revealed left lower lobe lung nodules and cavitary lesion.  This coupled with significant weight loss and history of smoking was concerning.  Plan was for him to follow-up with pulmonary as an outpatient.   On that admission he was also found to have new onset congestive heart failure.  Underwent cardiac catheterization which revealed triple-vessel disease.  Plan was for medical management until the lung issue had resolved and follow-up as an outpatient.  He was discharged on beta-blocker, ACE inhibitor, statin, Imdur, aspirin.  Patient readmitted 8/17-20/2020 with chest pain.  He ruled out for MI.  Underwent LHC and had 2 stents placed.   Today: CAD/HTN/CHF:  Has appt coming up with cardiology in 03/2019. -no CP.  + Episodic SOB.  Sleeps on 2 pillows.  Has not sleep last 3 nights due to SOB. +LE edema but is resolving.  Gained 10 pounds since last hospitalization. Has device to check BP bu not checking Reports compliance with Cozaar, metoprolol, furosemide, Imdur, atorvastatin, Plavix and aspirin.   Empyema/lung nodules:  Saw pulmonary NP since recent hospital discharge.  He has a follow-up again  next month.  Plan at that time is to repeat CAT scan of the chest and if nodule still present, he will have PET scan done. Loss 40 lbs in past 6 mths.  Denies any chronic cough or fever.  He was a pk/day smoker for 30 yrs.  He has cut back now to 1-2 cig QOD.  Trying to quit.   Not using anything ot help with cessation.  DM:   Lab Results  Component Value Date   HGBA1C 8.7 (H) 02/04/2019  Reports compliance with metformin and Glucotrol. Checking BS before dinner.  Gives range 120-180.  Bs coming down as far as he can tell.  -reports feeling of bloating when he eats and early satiety x 1-2 mths.  No problems swallowing -occasional blurred vision -no numbness in hands/feet  Anxiety disorder: Gives history of anxiety.  States that he was on clonazepam for a while.  In [facility], I see that he was on it for several months last year but nothing after August of last year until recent hospitalization.  He is reluctant to try anything else like SSRIs stating that he was on Zoloft in the past and he does not want to be on anything to make him crazy. He lives with his parents.  He is unemployed.  Does not have any children. Denies any street drug use.  Past medical history, social history, family history reviewed.   Patient Active Problem List   Diagnosis Date Noted   Lung nodule 02/23/2019   Tobacco abuse disorder 02/23/2019   Chest  pain 02/20/2019   Acute CHF (congestive heart failure) ([place]) 02/19/2019   3-vessel CAD    Shortness of breath    Coronary artery disease    Ischemic cardiomyopathy    Protein-calorie malnutrition, severe 02/05/2019   Empyema (HCC)    Parapneumonic effusion 02/04/2019     Current Outpatient Medications on File Prior to Visit  Medication Sig Dispense Refill   aspirin 81 MG chewable tablet Chew 1 tablet (81 mg total) by mouth daily. 30 tablet 0   atorvastatin (LIPITOR) 40 MG tablet Take 1 tablet (40 mg total) by mouth daily at 6  PM. 30 tablet 0   clonazePAM (KLONOPIN) 1 MG tablet Take 1 tablet (1 mg total) by mouth 2 (two) times daily as needed for anxiety. 30 tablet 0   clopidogrel (PLAVIX) 75 MG tablet Take 1 tablet (75 mg total) by mouth daily. 90 tablet 3   furosemide (LASIX) 20 MG tablet Take 1 tablet (20 mg total) by mouth daily. 30 tablet 0   glipiZIDE (GLUCOTROL) 5 MG tablet Take 1 tablet (5 mg total) by mouth 2 (two) times daily. 60 tablet 11   isosorbide mononitrate (IMDUR) 30 MG 24 hr tablet Take 1 tablet (30 mg total) by mouth daily. 30 tablet 0   losartan (COZAAR) 50 MG tablet Take 1 tablet (50 mg total) by mouth daily. 30 tablet 0   metFORMIN (GLUCOPHAGE) 500 MG tablet Take 1 tablet (500 mg total) by mouth 2 (two) times daily with a meal. 60 tablet 0   metoprolol succinate (TOPROL-XL) 100 MG 24 hr tablet Take 1 tablet (100 mg total) by mouth daily. Take with or immediately following a meal. 30 tablet 0   nitroGLYCERIN (NITROSTAT) 0.4 MG SL tablet Place 0.4 mg under the tongue every 5 (five) minutes as needed for chest pain.     No current facility-administered medications on file prior to visit.     No Known Allergies  Social History   Socioeconomic History   Marital status: Single    Spouse name: Not on file   Number of children: Not on file   Years of education: Not on file   Highest education level: Not on file  Occupational History   Not on file  Social Needs   Financial resource strain: Not on file   Food insecurity    Worry: Not on file    Inability: Not on file   Transportation needs    Medical: Not on file    Non-medical: Not on file  Tobacco Use   Smoking status: Former Smoker    Packs/day: 0.50    Years: 30.00    Pack years: 15.00    Quit date: 02/02/2019    Years since quitting: 0.0   Smokeless tobacco: Never Used   Tobacco comment: Patient states he quit, but he will take nicotine patch  Substance and Sexual Activity   Alcohol use: Not Currently    Drug use: Yes    Frequency: 2.0 times per week    Types: Marijuana   Sexual activity: Not Currently    Partners: Male  Lifestyle   Physical activity    Days per week: Not on file    Minutes per session: Not on file   Stress: Not on file  Relationships   Social connections    Talks on phone: Not on file    Gets together: Not on file    Attends religious service: Not on file    Active member of club or  organization: Not on file    Attends meetings of clubs or organizations: Not on file    Relationship status: Not on file   Intimate partner violence    Fear of current or ex partner: Not on file    Emotionally abused: Not on file    Physically abused: Not on file    Forced sexual activity: Not on file  Other Topics Concern   Not on file  Social History Narrative   Not on file    Family History  Problem Relation Age of Onset   CAD Mother    Hypertension Mother     Past Surgical History:  Procedure Laterality Date   CORONARY STENT INTERVENTION N/A 02/20/2019   Procedure: CORONARY STENT INTERVENTION;  Surgeon: Corky CraftsVaranasi, Jayadeep S, MD;  Location: [facility];  Service: Cardiovascular;  Laterality: N/A;   LEFT HEART CATH AND CORONARY ANGIOGRAPHY N/A 02/20/2019   Procedure: LEFT HEART CATH AND CORONARY ANGIOGRAPHY;  Surgeon: Corky CraftsVaranasi, Jayadeep S, MD;  [facility];  Service: Cardiovascular;  Laterality: N/A;   RIGHT/LEFT HEART CATH AND CORONARY ANGIOGRAPHY N/A 02/06/2019   Procedure: RIGHT/LEFT HEART CATH AND CORONARY ANGIOGRAPHY;  Surgeon: Lennette BihariKelly, Thomas A, MD;  Location: [facility];  Service: Cardiovascular;  Laterality: N/A;    ROS: Review of Systems Negative except as stated above  PHYSICAL EXAM: BP (!) 185/137    Pulse 97    Temp (!) 97.5 F (36.4 C) (Oral)    Resp 16    Ht 5\' 5"  (1.651 m)    Wt 120 lb (54.4 kg)    SpO2 97%    BMI 19.97 kg/m   Wt Readings from Last 3 Encounters:  03/05/19 120 lb (54.4 kg)  02/22/19 117 lb (53.1  kg)  02/21/19 110 lb 0.2 oz (49.9 kg)    Physical Exam  General appearance -alert middle-age Caucasian male who appears chronically ill and in NAD Mental status - normal mood, behavior, speech, dress, motor activity, and thought processes Eyes - pupils equal and reactive, extraocular eye movements intact Mouth - mucous membranes moist, pharynx normal without lesions Neck - supple, no significant adenopathy Chest - clear to auscultation, no wheezes, rales or rhonchi, symmetric air entry Heart - normal rate, regular rhythm, normal S1, S2, no murmurs, rubs, clicks or gallops.  Positive JVD to the angle of the jaw. Abdomen - soft, nontender, nondistended, no masses or organomegaly Extremities -trace lower extremity edema  Lab Results  Component Value Date   TSH 1.910 02/04/2019    CMP Latest Ref Rng & Units 02/21/2019 02/20/2019 02/19/2019  Glucose 70 - 99 mg/dL 161(W139(H) 960(A122(H) 99  BUN 6 - 20 mg/dL 15 11 12   Creatinine 0.61 - 1.24 mg/dL 5.400.92 9.810.85 1.910.87  Sodium 135 - 145 mmol/L 139 140 136  Potassium 3.5 - 5.1 mmol/L 3.6 3.0(L) 4.2  Chloride 98 - 111 mmol/L 102 101 102  CO2 22 - 32 mmol/L 27 24 23   Calcium 8.9 - 10.3 mg/dL 4.7(W8.6(L) 8.9 2.9(F8.7(L)  Total Protein 6.5 - 8.1 g/dL - 7.3 -  Total Bilirubin 0.3 - 1.2 mg/dL - 0.9 -  Alkaline Phos 38 - 126 U/L - 103 -  AST 15 - 41 U/L - 104(H) -  ALT 0 - 44 U/L - 180(H) -   Lipid Panel  No results found for: CHOL, TRIG, HDL, CHOLHDL, VLDL, LDLCALC, LDLDIRECT  CBC    Component Value Date/Time   WBC 11.3 (H) 02/21/2019 0329   RBC  4.15 (L) 02/21/2019 0329   HGB 12.9 (L) 02/21/2019 0329   HCT 38.6 (L) 02/21/2019 0329   PLT 335 02/21/2019 0329   MCV 93.0 02/21/2019 0329   MCH 31.1 02/21/2019 0329   MCHC 33.4 02/21/2019 0329   RDW 14.9 02/21/2019 0329   LYMPHSABS 1.4 02/20/2019 0400   MONOABS 1.0 02/20/2019 0400   EOSABS 0.2 02/20/2019 0400   BASOSABS 0.1 02/20/2019 0400   Depression screen PHQ 2/9 03/05/2019  Decreased Interest 3  Down,  Depressed, Hopeless 2  PHQ - 2 Score 5  Altered sleeping 3  Tired, decreased energy 3  Change in appetite 1  Feeling bad or failure about yourself  2  Trouble concentrating 2  Moving slowly or fidgety/restless 2  Suicidal thoughts 0  PHQ-9 Score 18   GAD 7 : Generalized Anxiety Score 03/05/2019  Nervous, Anxious, on Edge 1  Control/stop worrying 1  Worry too much - different things 1  Trouble relaxing 2  Restless 1  Easily annoyed or irritable 2  Afraid - awful might happen 1  Total GAD 7 Score 9     ASSESSMENT AND PLAN: 1. Hospital discharge follow-up  2. CAD in native artery 3. Chronic systolic congestive heart failure (HCC) Patient rate is 10 pounds since hospitalization.  Recommend he increase furosemide to 40 mg daily for the next 3 days then cut back to 20 mg daily.  Advised to limit salt.  Keep his upcoming appointment with cardiology. -He will get his medications through our pharmacy which would be cheaper for him. - metoprolol succinate (TOPROL-XL) 100 MG 24 hr tablet; Take 1 tablet (100 mg total) by mouth daily. Take with or immediately following a meal.  Dispense: 30 tablet; Refill: 6 - isosorbide mononitrate (IMDUR) 30 MG 24 hr tablet; Take 1 tablet (30 mg total) by mouth daily.  Dispense: 30 tablet; Refill: 6 - atorvastatin (LIPITOR) 40 MG tablet; Take 1 tablet (40 mg total) by mouth daily at 6 PM.  Dispense: 30 tablet; Refill: 6 - clopidogrel (PLAVIX) 75 MG tablet; Take 1 tablet (75 mg total) by mouth daily.  Dispense: 90 tablet; Refill: 3 - aspirin 81 MG chewable tablet; Chew 1 tablet (81 mg total) by mouth daily.  Dispense: 100 tablet; Refill: 1  4. Essential hypertension Not at goal.  Increase Cozaar from 50 to 100 mg daily. - losartan (COZAAR) 100 MG tablet; Take 1 tablet (100 mg total) by mouth daily.  Dispense: 30 tablet; Refill: 6  5. Lung nodules Work-up is planned by pulmonary who he will see again next month.  6. Weight loss We will need to rule out  any underlying lung cancer and get him up-to-date with age-appropriate cancer screenings.  I forgot to order HIV test.  We will do this on next visit. Encourage smoking cessation.   7. Tobacco dependence Advised to quit.  He has commended him on how he has cut back.  He will continue to wean himself off.  Encouraged him to set a quit date.  Less than 5 minutes spent on counseling.  8. Diabetes mellitus type 2 with complications, uncontrolled (HCC) Continue to monitor blood sugars.  I made no changes to his medications as glipizide was recently added. - Microalbumin / creatinine urine ratio - POCT glucose (manual entry) - glipiZIDE (GLUCOTROL) 5 MG tablet; Take 1 tablet (5 mg total) by mouth 2 (two) times daily.  Dispense: 60 tablet; Refill: 11 - metFORMIN (GLUCOPHAGE) 500 MG tablet; Take 1 tablet (500 mg total)  by mouth 2 (two) times daily with a meal.  Dispense: 60 tablet; Refill: 6  9. Anxiety disorder, unspecified type Discussed management of anxiety.  I recommend trying him with an SSRI but patient is not open to that.  He wants back on clonazepam which can be habit-forming.  And which I hardly used for long-term management of anxiety at home.  I have chosen to try him with BuSpar instead - busPIRone (BUSPAR) 5 MG tablet; Take 1 tablet (5 mg total) by mouth 2 (two) times daily. For anxiety  Dispense: 60 tablet; Refill: 2  10. Influenza vaccination declined   11. Positive depression screening Patient declines SSRIs.  Denies any suicidal ideation at this time     Patient was given the opportunity to ask questions.  Patient verbalized understanding of the plan and was able to repeat key elements of the plan.   No orders of the defined types were placed in this encounter.    Requested Prescriptions    No prescriptions requested or ordered in this encounter    No follow-ups on file.  Jonah Blue, MD, FACP
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
## 2019-03-07 ENCOUNTER — Encounter (HOSPITAL_COMMUNITY): Payer: Self-pay | Admitting: Interventional Cardiology

## 2019-03-07 DIAGNOSIS — F419 Anxiety disorder, unspecified: Secondary | ICD-10-CM | POA: Insufficient documentation

## 2019-03-07 DIAGNOSIS — F17200 Nicotine dependence, unspecified, uncomplicated: Secondary | ICD-10-CM | POA: Insufficient documentation

## 2019-03-07 DIAGNOSIS — I5022 Chronic systolic (congestive) heart failure: Secondary | ICD-10-CM | POA: Insufficient documentation

## 2019-03-07 DIAGNOSIS — R634 Abnormal weight loss: Secondary | ICD-10-CM | POA: Insufficient documentation

## 2019-03-07 DIAGNOSIS — Z2821 Immunization not carried out because of patient refusal: Secondary | ICD-10-CM | POA: Insufficient documentation

## 2019-03-07 DIAGNOSIS — I10 Essential (primary) hypertension: Secondary | ICD-10-CM | POA: Insufficient documentation

## 2019-03-08 ENCOUNTER — Other Ambulatory Visit: Payer: Self-pay

## 2019-03-08 ENCOUNTER — Encounter: Payer: Self-pay | Admitting: Cardiology

## 2019-03-08 ENCOUNTER — Ambulatory Visit (INDEPENDENT_AMBULATORY_CARE_PROVIDER_SITE_OTHER): Payer: Self-pay | Admitting: Cardiology

## 2019-03-08 VITALS — BP 144/100 | HR 103 | Ht 66.0 in | Wt 118.4 lb

## 2019-03-08 DIAGNOSIS — I10 Essential (primary) hypertension: Secondary | ICD-10-CM

## 2019-03-08 DIAGNOSIS — I25118 Atherosclerotic heart disease of native coronary artery with other forms of angina pectoris: Secondary | ICD-10-CM

## 2019-03-08 DIAGNOSIS — E782 Mixed hyperlipidemia: Secondary | ICD-10-CM

## 2019-03-08 DIAGNOSIS — J869 Pyothorax without fistula: Secondary | ICD-10-CM

## 2019-03-08 DIAGNOSIS — I5022 Chronic systolic (congestive) heart failure: Secondary | ICD-10-CM

## 2019-03-08 DIAGNOSIS — I255 Ischemic cardiomyopathy: Secondary | ICD-10-CM

## 2019-03-08 DIAGNOSIS — R634 Abnormal weight loss: Secondary | ICD-10-CM

## 2019-03-08 LAB — COMPREHENSIVE METABOLIC PANEL
ALT: 27 IU/L (ref 0–44)
AST: 19 IU/L (ref 0–40)
Albumin/Globulin Ratio: 1.3 (ref 1.2–2.2)
Albumin: 4 g/dL (ref 3.8–4.9)
Alkaline Phosphatase: 192 IU/L — ABNORMAL HIGH (ref 39–117)
BUN/Creatinine Ratio: 40 — ABNORMAL HIGH (ref 9–20)
BUN: 44 mg/dL — ABNORMAL HIGH (ref 6–24)
Bilirubin Total: 1 mg/dL (ref 0.0–1.2)
CO2: 19 mmol/L — ABNORMAL LOW (ref 20–29)
Calcium: 10 mg/dL (ref 8.7–10.2)
Chloride: 96 mmol/L (ref 96–106)
Creatinine, Ser: 1.11 mg/dL (ref 0.76–1.27)
GFR calc Af Amer: 87 mL/min/{1.73_m2} (ref >59)
GFR calc non Af Amer: 75 mL/min/{1.73_m2} (ref >59)
Globulin, Total: 3.2 g/dL (ref 1.5–4.5)
Glucose: 355 mg/dL — ABNORMAL HIGH (ref 65–99)
Potassium: 4.1 mmol/L (ref 3.5–5.2)
Sodium: 138 mmol/L (ref 134–144)
Total Protein: 7.2 g/dL (ref 6.0–8.5)

## 2019-03-08 LAB — LIPID PANEL
Chol/HDL Ratio: 3.2 ratio (ref 0.0–5.0)
Cholesterol, Total: 146 mg/dL (ref 100–199)
HDL: 46 mg/dL (ref >39)
LDL Chol Calc (NIH): 80 mg/dL (ref 0–99)
Triglycerides: 111 mg/dL (ref 0–149)
VLDL Cholesterol Cal: 20 mg/dL (ref 5–40)

## 2019-03-08 NOTE — Patient Instructions (Addendum)
Medication Instructions:   Your physician recommends that you continue on your current medications as directed. Please refer to the Current Medication list given to you today.  If you need a refill on your cardiac medications before your next appointment, please call your pharmacy.   Lab work:  You will have labs drawn today: CMET and Lipid panel  If you have labs (blood work) drawn today and your tests are completely normal, you will receive your results only by: Marland Kitchen MyChart Message (if you have MyChart) OR . A paper copy in the mail If you have any lab test that is abnormal or we need to change your treatment, we will call you to review the results.  Testing/Procedures:  None ordered today  Follow-Up:  On 03/15/19 at 11:30AM with Daune Perch, NP for a blood pressure check.   On 05/14/19 with Larae Grooms, MD for a 2 month follow up.

## 2019-03-09 ENCOUNTER — Telehealth: Payer: Self-pay

## 2019-03-09 NOTE — Telephone Encounter (Signed)
I spoke with pt to get permission to speak with his mother. He requested I go over results with him.  I reviewed lab results and recommendations from Cecilie Kicks, NP with him. Will forward lab results to primary care.

## 2019-03-09 NOTE — Telephone Encounter (Signed)
Notes recorded by Frederik Schmidt, RN on 03/09/2019 at 9:10 AM EDT  [id number] 9/4  ------

## 2019-03-09 NOTE — Telephone Encounter (Signed)
Follow up   Patient's mother is returning call for lab results. Please call.

## 2019-03-09 NOTE — Telephone Encounter (Signed)
Follow up   Patient's mother is calling back to get results per the previous message. Please call.

## 2019-03-09 NOTE — Telephone Encounter (Signed)
I returned call and spoke with pt who gave me permission to speak with his mother and handed phone to her. I reviewed lab results with pt's mother.

## 2019-03-09 NOTE — Telephone Encounter (Signed)
-----   Message from Isaiah Serge, NP sent at 03/08/2019  6:09 PM EDT ----- Please let pt know he needs his glipizide and metformin his glucose is elevated, he had stopped.  Please send copy of labs to PCP.  His cholesterol is above goal but will hold off on increasing meds for today, would adjust on OV next week.  Pt needs complete instructions.

## 2019-03-13 ENCOUNTER — Emergency Department (HOSPITAL_COMMUNITY): Payer: Medicaid Other

## 2019-03-13 ENCOUNTER — Encounter (HOSPITAL_COMMUNITY): Payer: Self-pay | Admitting: Internal Medicine

## 2019-03-13 ENCOUNTER — Inpatient Hospital Stay (HOSPITAL_COMMUNITY)
Admission: EM | Admit: 2019-03-13 | Discharge: 2019-03-16 | DRG: 292 | Disposition: A | Discharge status: Home / Self Care | Payer: Medicaid Other | Attending: Internal Medicine | Admitting: Internal Medicine

## 2019-03-13 ENCOUNTER — Other Ambulatory Visit: Payer: Self-pay

## 2019-03-13 ENCOUNTER — Telehealth: Payer: Self-pay | Admitting: *Deleted

## 2019-03-13 ENCOUNTER — Telehealth: Payer: Self-pay | Admitting: Adult Health

## 2019-03-13 DIAGNOSIS — I25119 Atherosclerotic heart disease of native coronary artery with unspecified angina pectoris: Secondary | ICD-10-CM

## 2019-03-13 DIAGNOSIS — E440 Moderate protein-calorie malnutrition: Secondary | ICD-10-CM | POA: Insufficient documentation

## 2019-03-13 DIAGNOSIS — I5023 Acute on chronic systolic (congestive) heart failure: Secondary | ICD-10-CM | POA: Diagnosis present

## 2019-03-13 DIAGNOSIS — I255 Ischemic cardiomyopathy: Secondary | ICD-10-CM | POA: Diagnosis present

## 2019-03-13 DIAGNOSIS — E1165 Type 2 diabetes mellitus with hyperglycemia: Secondary | ICD-10-CM | POA: Diagnosis present

## 2019-03-13 DIAGNOSIS — Z7982 Long term (current) use of aspirin: Secondary | ICD-10-CM

## 2019-03-13 DIAGNOSIS — F17210 Nicotine dependence, cigarettes, uncomplicated: Secondary | ICD-10-CM | POA: Diagnosis present

## 2019-03-13 DIAGNOSIS — I493 Ventricular premature depolarization: Secondary | ICD-10-CM | POA: Diagnosis present

## 2019-03-13 DIAGNOSIS — I10 Essential (primary) hypertension: Secondary | ICD-10-CM | POA: Diagnosis present

## 2019-03-13 DIAGNOSIS — R64 Cachexia: Secondary | ICD-10-CM | POA: Diagnosis present

## 2019-03-13 DIAGNOSIS — F419 Anxiety disorder, unspecified: Secondary | ICD-10-CM | POA: Diagnosis present

## 2019-03-13 DIAGNOSIS — E876 Hypokalemia: Secondary | ICD-10-CM | POA: Diagnosis present

## 2019-03-13 DIAGNOSIS — Z681 Body mass index (BMI) 19 or less, adult: Secondary | ICD-10-CM

## 2019-03-13 DIAGNOSIS — E1151 Type 2 diabetes mellitus with diabetic peripheral angiopathy without gangrene: Secondary | ICD-10-CM | POA: Diagnosis present

## 2019-03-13 DIAGNOSIS — G459 Transient cerebral ischemic attack, unspecified: Secondary | ICD-10-CM | POA: Insufficient documentation

## 2019-03-13 DIAGNOSIS — E1159 Type 2 diabetes mellitus with other circulatory complications: Secondary | ICD-10-CM | POA: Diagnosis present

## 2019-03-13 DIAGNOSIS — I5021 Acute systolic (congestive) heart failure: Secondary | ICD-10-CM | POA: Diagnosis present

## 2019-03-13 DIAGNOSIS — I5022 Chronic systolic (congestive) heart failure: Secondary | ICD-10-CM

## 2019-03-13 DIAGNOSIS — I248 Other forms of acute ischemic heart disease: Secondary | ICD-10-CM | POA: Diagnosis present

## 2019-03-13 DIAGNOSIS — I110 Hypertensive heart disease with heart failure: Principal | ICD-10-CM | POA: Diagnosis present

## 2019-03-13 DIAGNOSIS — Z79899 Other long term (current) drug therapy: Secondary | ICD-10-CM

## 2019-03-13 DIAGNOSIS — I252 Old myocardial infarction: Secondary | ICD-10-CM

## 2019-03-13 DIAGNOSIS — Z20828 Contact with and (suspected) exposure to other viral communicable diseases: Secondary | ICD-10-CM | POA: Diagnosis present

## 2019-03-13 DIAGNOSIS — Z955 Presence of coronary angioplasty implant and graft: Secondary | ICD-10-CM

## 2019-03-13 DIAGNOSIS — E785 Hyperlipidemia, unspecified: Secondary | ICD-10-CM | POA: Diagnosis present

## 2019-03-13 DIAGNOSIS — R0602 Shortness of breath: Secondary | ICD-10-CM

## 2019-03-13 DIAGNOSIS — R918 Other nonspecific abnormal finding of lung field: Secondary | ICD-10-CM | POA: Diagnosis present

## 2019-03-13 DIAGNOSIS — Z8249 Family history of ischemic heart disease and other diseases of the circulatory system: Secondary | ICD-10-CM

## 2019-03-13 DIAGNOSIS — I2510 Atherosclerotic heart disease of native coronary artery without angina pectoris: Secondary | ICD-10-CM | POA: Diagnosis present

## 2019-03-13 DIAGNOSIS — Z8701 Personal history of pneumonia (recurrent): Secondary | ICD-10-CM

## 2019-03-13 DIAGNOSIS — Z7902 Long term (current) use of antithrombotics/antiplatelets: Secondary | ICD-10-CM

## 2019-03-13 DIAGNOSIS — E43 Unspecified severe protein-calorie malnutrition: Secondary | ICD-10-CM | POA: Diagnosis present

## 2019-03-13 LAB — BASIC METABOLIC PANEL
Anion gap: 12 (ref 5–15)
BUN: 40 mg/dL — ABNORMAL HIGH (ref 6–20)
CO2: 24 mmol/L (ref 22–32)
Calcium: 9.5 mg/dL (ref 8.9–10.3)
Chloride: 103 mmol/L (ref 98–111)
Creatinine, Ser: 1.13 mg/dL (ref 0.61–1.24)
GFR calc Af Amer: >60 mL/min (ref >60)
GFR calc non Af Amer: >60 mL/min (ref >60)
Glucose, Bld: 348 mg/dL — ABNORMAL HIGH (ref 70–99)
Potassium: 4.4 mmol/L (ref 3.5–5.1)
Sodium: 139 mmol/L (ref 135–145)

## 2019-03-13 LAB — CBC
HCT: 46.3 % (ref 39.0–52.0)
Hemoglobin: 14.9 g/dL (ref 13.0–17.0)
MCH: 31.8 pg (ref 26.0–34.0)
MCHC: 32.2 g/dL (ref 30.0–36.0)
MCV: 98.7 fL (ref 80.0–100.0)
Platelets: 228 10*3/uL (ref 150–400)
RBC: 4.69 MIL/uL (ref 4.22–5.81)
RDW: 17.8 % — ABNORMAL HIGH (ref 11.5–15.5)
WBC: 8.8 10*3/uL (ref 4.0–10.5)
nRBC: 0.2 % (ref 0.0–0.2)

## 2019-03-13 LAB — TROPONIN I (HIGH SENSITIVITY)
Troponin I (High Sensitivity): 104 ng/L (ref <18)
Troponin I (High Sensitivity): 92 ng/L — ABNORMAL HIGH (ref <18)

## 2019-03-13 LAB — GLUCOSE, CAPILLARY: Glucose-Capillary: 320 mg/dL — ABNORMAL HIGH (ref 70–99)

## 2019-03-13 LAB — BRAIN NATRIURETIC PEPTIDE: B Natriuretic Peptide: >4500 pg/mL — ABNORMAL HIGH (ref 0.0–100.0)

## 2019-03-13 LAB — SARS CORONAVIRUS 2 BY RT PCR (HOSPITAL ORDER, PERFORMED IN ~~LOC~~ HOSPITAL LAB): SARS Coronavirus 2: NEGATIVE

## 2019-03-13 MED ORDER — NITROGLYCERIN 0.4 MG SL SUBL
0.4000 mg | SUBLINGUAL_TABLET | SUBLINGUAL | Status: DC | PRN
  Administered 2019-03-15: 0.4 mg via SUBLINGUAL
  Filled 2019-03-13: qty 1

## 2019-03-13 MED ORDER — ONDANSETRON HCL 4 MG PO TABS: 4.0000 mg | ORAL_TABLET | Freq: Four times a day (QID) | ORAL | Status: DC | PRN

## 2019-03-13 MED ORDER — ENOXAPARIN SODIUM 30 MG/0.3ML ~~LOC~~ SOLN
30.0000 mg | SUBCUTANEOUS | Status: DC
  Administered 2019-03-14 – 2019-03-15 (×2): 30 mg via SUBCUTANEOUS
  Filled 2019-03-13 (×2): qty 0.3

## 2019-03-13 MED ORDER — CLOPIDOGREL BISULFATE 75 MG PO TABS
75.0000 mg | ORAL_TABLET | Freq: Every day | ORAL | Status: DC
  Administered 2019-03-14 – 2019-03-16 (×3): 75 mg via ORAL
  Filled 2019-03-13 (×3): qty 1

## 2019-03-13 MED ORDER — ASPIRIN EC 81 MG PO TBEC
81.0000 mg | DELAYED_RELEASE_TABLET | Freq: Every day | ORAL | Status: DC
  Administered 2019-03-14 – 2019-03-16 (×3): 81 mg via ORAL
  Filled 2019-03-13 (×3): qty 1

## 2019-03-13 MED ORDER — METOPROLOL SUCCINATE ER 100 MG PO TB24
100.0000 mg | ORAL_TABLET | Freq: Every day | ORAL | Status: DC
  Administered 2019-03-14 – 2019-03-16 (×3): 100 mg via ORAL
  Filled 2019-03-13 (×4): qty 1

## 2019-03-13 MED ORDER — ASPIRIN 81 MG PO CHEW
324.0000 mg | CHEWABLE_TABLET | Freq: Once | ORAL | Status: AC
  Administered 2019-03-13: 324 mg via ORAL
  Filled 2019-03-13: qty 4

## 2019-03-13 MED ORDER — ONDANSETRON HCL 4 MG/2ML IJ SOLN: 4.0000 mg | Freq: Four times a day (QID) | INTRAMUSCULAR | Status: DC | PRN

## 2019-03-13 MED ORDER — ACETAMINOPHEN 325 MG PO TABS: 650.0000 mg | ORAL_TABLET | Freq: Four times a day (QID) | ORAL | Status: DC | PRN

## 2019-03-13 MED ORDER — ATORVASTATIN CALCIUM 40 MG PO TABS
40.0000 mg | ORAL_TABLET | Freq: Every day | ORAL | Status: DC
  Administered 2019-03-14 – 2019-03-15 (×2): 40 mg via ORAL
  Filled 2019-03-13 (×2): qty 1

## 2019-03-13 MED ORDER — ISOSORBIDE MONONITRATE ER 30 MG PO TB24
30.0000 mg | ORAL_TABLET | Freq: Every day | ORAL | Status: DC
  Administered 2019-03-13 – 2019-03-16 (×4): 30 mg via ORAL
  Filled 2019-03-13 (×4): qty 1

## 2019-03-13 MED ORDER — FUROSEMIDE 10 MG/ML IJ SOLN
40.0000 mg | Freq: Two times a day (BID) | INTRAMUSCULAR | Status: DC
  Administered 2019-03-13 – 2019-03-14 (×2): 40 mg via INTRAVENOUS
  Filled 2019-03-13 (×2): qty 4

## 2019-03-13 MED ORDER — SODIUM CHLORIDE 0.9% FLUSH
3.0000 mL | Freq: Once | INTRAVENOUS | Status: AC
  Administered 2019-03-13: 3 mL via INTRAVENOUS

## 2019-03-13 MED ORDER — ENSURE ENLIVE PO LIQD
237.0000 mL | Freq: Two times a day (BID) | ORAL | Status: DC
  Administered 2019-03-14 – 2019-03-16 (×5): 237 mL via ORAL

## 2019-03-13 MED ORDER — LOSARTAN POTASSIUM 50 MG PO TABS
100.0000 mg | ORAL_TABLET | Freq: Every day | ORAL | Status: DC
  Administered 2019-03-13 – 2019-03-16 (×4): 100 mg via ORAL
  Filled 2019-03-13 (×5): qty 2

## 2019-03-13 MED ORDER — INSULIN ASPART 100 UNIT/ML ~~LOC~~ SOLN
0.0000 [IU] | Freq: Three times a day (TID) | SUBCUTANEOUS | Status: DC
  Administered 2019-03-14: 5 [IU] via SUBCUTANEOUS
  Administered 2019-03-14: 7 [IU] via SUBCUTANEOUS
  Administered 2019-03-15: 2 [IU] via SUBCUTANEOUS
  Administered 2019-03-15: 5 [IU] via SUBCUTANEOUS

## 2019-03-13 MED ORDER — LORAZEPAM 2 MG/ML IJ SOLN
1.0000 mg | Freq: Once | INTRAMUSCULAR | Status: AC
  Administered 2019-03-13: 1 mg via INTRAVENOUS
  Filled 2019-03-13: qty 1

## 2019-03-13 MED ORDER — ACETAMINOPHEN 650 MG RE SUPP: 650.0000 mg | Freq: Four times a day (QID) | RECTAL | Status: DC | PRN

## 2019-03-13 NOTE — H&P (Addendum)
History and Physical    Rolland Bredahl EBR:[id number] DOB: Sep 21, 1965 DOA: 03/13/2019  PCP: Marcine Matar, MD  Patient coming from: Home.  Chief Complaint: Shortness of breath.  HPI: Mark Pittman is a 53 y.o. male with history of systolic CHF last EF measured was 25 to 30% in August 2020 who was admitted last month for empyema had undergone chest tube placement during which patient was found to have a low EF underwent cardiac cath was found to have three-vessel disease was subsequently discharged and readmitted after patient had again chest pain during which patient had stents placed and was discharged on February 21, 2019.  Patient presents to the ER because of progressive shortness of breath with exertion over the last few weeks since discharge.  Denies chest pain.  Denies any fever chills but has gained 10 pounds.  ED Course: In the ER chest x-ray shows congestion with bilateral pleural effusion more on the right side but afebrile.  Lab work showed WBC count of 8.8 high-sensitivity troponin was 104 92 BNP more than 4500.  Patient symptoms were consistent with acute CHF and was given 40 mg IV Lasix cardiology consulted.  COVID-19 test was negative.  Review of Systems: As per HPI, rest all negative.   Past Medical History:  Diagnosis Date  . CHF (congestive heart failure), NYHA class I (HCC) 2005   patient  . Diabetes (HCC) 2018   Patient  . MI (myocardial infarction) (HCC) 2015   Patient  . Smoker 1987   Source    Past Surgical History:  Procedure Laterality Date  . CORONARY STENT INTERVENTION N/A 02/20/2019   Procedure: CORONARY STENT INTERVENTION;  Surgeon: Corky Crafts, MD;  Location: [facility] INVASIVE CV LAB;  Service: Cardiovascular;  Laterality: N/A;  . LEFT HEART CATH AND CORONARY ANGIOGRAPHY N/A 02/20/2019   Procedure: LEFT HEART CATH AND CORONARY ANGIOGRAPHY;  Surgeon: Corky Crafts, MD;  Location: [facility] INVASIVE CV LAB;  Service: Cardiovascular;  Laterality:  N/A;  . RIGHT/LEFT HEART CATH AND CORONARY ANGIOGRAPHY N/A 02/06/2019   Procedure: RIGHT/LEFT HEART CATH AND CORONARY ANGIOGRAPHY;  Surgeon: Lennette Bihari, MD;  Location: [facility] INVASIVE CV LAB;  Service: Cardiovascular;  Laterality: N/A;     reports that he quit smoking about 5 weeks ago. He has a 15.00 pack-year smoking history. He has never used smokeless tobacco. He reports previous alcohol use. He reports current drug use. Frequency: 2.00 times per week. Drug: Marijuana.  No Known Allergies  Family History  Problem Relation Age of Onset  . CAD Mother   . Hypertension Mother     Prior to Admission medications   Medication Sig Start Date End Date Taking? Authorizing Provider  aspirin 81 MG chewable tablet Chew 1 tablet (81 mg total) by mouth daily. 03/05/19  Yes Marcine Matar, MD  atorvastatin (LIPITOR) 40 MG tablet Take 1 tablet (40 mg total) by mouth daily at 6 PM. 03/05/19  Yes Marcine Matar, MD  clopidogrel (PLAVIX) 75 MG tablet Take 1 tablet (75 mg total) by mouth daily. 03/05/19  Yes Marcine Matar, MD  furosemide (LASIX) 20 MG tablet Take 1 tablet (20 mg total) by mouth daily. 03/05/19  Yes Marcine Matar, MD  glipiZIDE (GLUCOTROL) 5 MG tablet Take 1 tablet (5 mg total) by mouth 2 (two) times daily. 03/05/19 03/04/20 Yes Marcine Matar, MD  isosorbide mononitrate (IMDUR) 30 MG 24 hr tablet Take 1 tablet (30 mg total) by mouth daily. 03/05/19  Yes Laural Benes,  Dalbert Batman, MD  losartan (COZAAR) 100 MG tablet Take 1 tablet (100 mg total) by mouth daily. 03/05/19  Yes [name], MD  metFORMIN (GLUCOPHAGE) 500 MG tablet Take 1 tablet (500 mg total) by mouth 2 (two) times daily with a meal. 03/05/19  Yes [name], MD  metoprolol succinate (TOPROL-XL) 100 MG 24 hr tablet Take 1 tablet (100 mg total) by mouth daily. Take with or immediately following a meal. 03/05/19  Yes [name], MD  nitroGLYCERIN (NITROSTAT) 0.4 MG SL tablet Place 0.4 mg under the tongue  every 5 (five) minutes as needed for chest pain.   Yes [provider]    Physical Exam: Constitutional: Moderately built and nourished. Vitals:   03/13/19 1900 03/13/19 1915 03/13/19 2146 03/13/19 2224  BP: (!) 168/102 (!) 158/106 (!) 144/105 (!) 161/118  Pulse: 91 89 86 93  Resp: (!) 27 (!) 28 12   Temp:    98.1 F (36.7 C)  TempSrc:    Oral  SpO2: (!) 88% (!) 70% 97% 99%  Weight:    54.4 kg  Height:    5\' 6"  (1.676 m)   Eyes: Anicteric no pallor. ENMT: No discharge from the ears eyes nose or mouth. Neck: No mass felt.  JVD elevated. Respiratory: No rhonchi or crepitations. Cardiovascular: S1-S2 heard. Abdomen: Nontender bowel sounds present. Musculoskeletal: No edema.  No joint effusion. Skin: No rash. Neurologic: Alert awake oriented to time place and person.  Moves all extremities. Psychiatric: Appears normal.  Normal affect.   Labs on Admission: I have personally reviewed following labs and imaging studies  CBC: Recent Labs  Lab 03/13/19 1249  WBC 8.8  HGB 14.9  HCT 46.3  MCV 98.7  PLT 062   Basic Metabolic Panel: Recent Labs  Lab 03/08/19 0900 03/13/19 1249  NA 138 139  K 4.1 4.4  CL 96 103  CO2 19* 24  GLUCOSE 355* 348*  BUN 44* 40*  CREATININE 1.11 1.13  CALCIUM 10.0 9.5   GFR: Estimated Creatinine Clearance: 58.2 mL/min (by C-G formula based on SCr of 1.13 mg/dL). Liver Function Tests: Recent Labs  Lab 03/08/19 0900  AST 19  ALT 27  ALKPHOS 192*  BILITOT 1.0  PROT 7.2  ALBUMIN 4.0   No results for input(s): LIPASE, AMYLASE in the last 168 hours. No results for input(s): AMMONIA in the last 168 hours. Coagulation Profile: No results for input(s): INR, PROTIME in the last 168 hours. Cardiac Enzymes: No results for input(s): CKTOTAL, CKMB, CKMBINDEX, TROPONINI in the last 168 hours. BNP (last 3 results) No results for input(s): PROBNP in the last 8760 hours. HbA1C: No results for input(s): HGBA1C in the last 72 hours. CBG:  Recent Labs  Lab 03/13/19 2227  GLUCAP 320*   Lipid Profile: No results for input(s): CHOL, HDL, LDLCALC, TRIG, CHOLHDL, LDLDIRECT in the last 72 hours. Thyroid Function Tests: No results for input(s): TSH, T4TOTAL, FREET4, T3FREE, THYROIDAB in the last 72 hours. Anemia Panel: No results for input(s): VITAMINB12, FOLATE, FERRITIN, TIBC, IRON, RETICCTPCT in the last 72 hours. Urine analysis: No results found for: COLORURINE, APPEARANCEUR, LABSPEC, PHURINE, GLUCOSEU, HGBUR, BILIRUBINUR, KETONESUR, PROTEINUR, UROBILINOGEN, NITRITE, LEUKOCYTESUR Sepsis Labs: @LABRCNTIP (procalcitonin:4,lacticidven:4) ) Recent Results (from the past 240 hour(s))  SARS Coronavirus 2 [facility] order, Performed in [facility] hospital lab) Nasopharyngeal Nasopharyngeal Swab     Status: None   Collection Time: 03/13/19  5:19 PM   Specimen: Nasopharyngeal Swab  Result Value Ref Range Status  SARS Coronavirus 2 NEGATIVE NEGATIVE Final    Comment: (NOTE) If result is NEGATIVE SARS-CoV-2 target nucleic acids are NOT DETECTED. The SARS-CoV-2 RNA is generally detectable in upper and lower  respiratory specimens during the acute phase of infection. The lowest  concentration of SARS-CoV-2 viral copies this assay can detect is 250  copies / mL. A negative result does not preclude SARS-CoV-2 infection  and should not be used as the sole basis for treatment or other  patient management decisions.  A negative result may occur with  improper specimen collection / handling, submission of specimen other  than nasopharyngeal swab, presence of viral mutation(s) within the  areas targeted by this assay, and inadequate number of viral copies  (<250 copies / mL). A negative result must be combined with clinical  observations, patient history, and epidemiological information. If result is POSITIVE SARS-CoV-2 target nucleic acids are DETECTED. The SARS-CoV-2 RNA is generally detectable in upper and lower  respiratory  specimens dur ing the acute phase of infection.  Positive  results are indicative of active infection with SARS-CoV-2.  Clinical  correlation with patient history and other diagnostic information is  necessary to determine patient infection status.  Positive results do  not rule out bacterial infection or co-infection with other viruses. If result is PRESUMPTIVE POSTIVE SARS-CoV-2 nucleic acids MAY BE PRESENT.   A presumptive positive result was obtained on the submitted specimen  and confirmed on repeat testing.  While 2019 novel coronavirus  (SARS-CoV-2) nucleic acids may be present in the submitted sample  additional confirmatory testing may be necessary for epidemiological  and / or clinical management purposes  to differentiate between  SARS-CoV-2 and other Sarbecovirus currently known to infect humans.  If clinically indicated additional testing with an alternate test  methodology [phone]([id number]) is advised. The SARS-CoV-2 RNA is generally  detectable in upper and lower respiratory sp ecimens during the acute  phase of infection. The expected result is Negative. Fact Sheet for Patients:  BoilerBrush.com.cyhttps://www.fda.gov/media/136312/download Fact Sheet for Healthcare Providers: https://pope.com/https://www.fda.gov/media/136313/download This test is not yet approved or cleared by the [place] FDA and has been authorized for detection and/or diagnosis of SARS-CoV-2 by FDA under an Emergency Use Authorization (EUA).  This EUA will remain in effect (meaning this test can be used) for the duration of the COVID-19 declaration under Section 564(b)(1) of the Act, 21 U.S.C. section 360bbb-3(b)(1), unless the authorization is terminated or revoked sooner. Performed at [facility], [address] [place], [place] [address]      Radiological Exams on Admission: Dg Chest 2 View  Result Date: 03/13/2019 CLINICAL DATA:  Chest pain. EXAM: CHEST - 2 VIEW COMPARISON:  02/20/2019 and 02/19/2019 FINDINGS: Persistent  cardiomegaly. Pulmonary vascularity is normal. Small persistent right pleural effusion with lateral pleural thickening. Irregular area of residual scarring at the right lung base, slightly less prominent. Tiny residual left effusion. No discrete abnormalities of the left lung. No significant bone abnormality. IMPRESSION: Persistent small right pleural effusion with scarring at the right lung base. Tiny residual left effusion. Persistent cardiomegaly. No acute findings. Electronically Signed   By: Francene BoyersJames  Maxwell M.D.   On: 03/13/2019 13:29    EKG: Independently reviewed.  Normal sinus rhythm with LVH.  Assessment/Plan Principal Problem:   Acute systolic CHF (congestive heart failure) (HCC) Active Problems:   Protein-calorie malnutrition, severe   Coronary artery disease   Ischemic cardiomyopathy   Essential hypertension   Type 2 diabetes mellitus with vascular disease (HCC)  TIA (transient ischemic attack)    1. Acute on chronic systolic heart failure last EF measured was 25 to 30% in August last month -appreciate cardiology consult patient is placed on Lasix 40 mg IV every 12 and patient is on Toprol-XL and losartan.  Follow intake output metabolic panel daily weights. 2. CAD status post recent stenting denies any chest pain on antiplatelet agents aspirin Plavix beta blockers and statins.  Patient is also on Imdur. 3. Diabetes mellitus type 2 with hyperglycemia we will keep patient on sliding scale coverage.  Follow metabolic panel closely to make sure there is no development of any anion gap metabolic acidosis. 4. History of recent empyema has been treated completely presently no symptoms. 5. History of lung nodules being followed by pulmonologist.   DVT prophylaxis: Lovenox. Code Status: Full code. Family Communication: Discussed with patient. Disposition Plan: Home. Consults called: Cardiology. Admission status: Observation.   Eduard ClosArshad N Rayaan Lorah MD [facility] Pager [phone]  [phone].  If 7PM-7AM, please contact night-coverage [email or web address] Password [email or web address]  03/13/2019, 11:18 PM

## 2019-03-13 NOTE — Telephone Encounter (Signed)
Pts mom called and spoke with a our CT scheduler about the pts upcoming Chest CT that was ordered by his [place] NP from [place].  Per the pts mom, she was telling our CT tech that she is going to be taking the pt back to [facility] ER today, for he is not improving from his recent hospitalization at [facility], for pneumonia. CT Tech then transferred this call to triage for further assistance. When speaking with the pts mom, she stated that she is giving the pt all his meds prescribed since he saw Pulmonology, but is seeing no improvement, and his antibiotics are complete. Pts mom then said she feels that he needs to go back to the hospital for further testing and possible admission, for worsening pneumonia. Asked the pts Mom if she has talked to his Pulmonologist office yet, to inform them of pts symptoms, and completed course of therapy they prescribed him.  Per the pts mom, she thought that we were the Pulmonologist office.  Gave the pts Mom the contact information to his Pulmonologist office at [facility], Rexene Edison NP.  Mother verbalized understanding and states she will call there now and update them on pts health status, and worsening pneumonia.  Mother will call to ask them if they agree with her plan to take him back to the ER, for further evaluation of no improvement in his pneumonia.  Mother was very gracious for all the assistance provided.

## 2019-03-13 NOTE — ED Triage Notes (Signed)
C/o chest pain and generalized weakness. EMS brought patient from home, patient had two stents placed a few weeks ago.  His parents report that they just found out patient was not taking his medication.  They then had him call MD office this morning and were told to have him come to ED  He has been having chest pain and generalized weakness since discharged form hospital

## 2019-03-13 NOTE — Telephone Encounter (Signed)
Spoke with Mark Pittman and she states she ended up calling the ambulance and they took them to the hospital because they think he may have fluid or pneumonia in his lungs. I advised pt to call us after his hospitl visit to schedule a hospital follow up. Nothing further is needed.

## 2019-03-13 NOTE — ED Provider Notes (Signed)
[facility] EMERGENCY [facility] Provider Note   CSN: [id number] Arrival date & time: 03/13/19  1218     History   Chief Complaint Chief Complaint  Patient presents with  . Weakness  . Chest Pain    HPI Rosalita ChessmanJeffrey Roscher is a 53 y.o. male past medical history of coronary artery disease with 2 stents placed on February 20, 2019, hypertension, diabetes, history of pleural effusion congestive heart failure, presenting to the emergency department with complaint of shortness of breath.  He reports he has had progressively worsening shortness of breath since his discharge from the hospital in August.  He came to the hospital today because he feels like his shortness of breath is worse.  Described as both depth dyspnea and orthopnea, but also feels slightly short of breath at rest.  He denies any chest pain or pressure.  He denies nausea vomiting fevers or chills.  Denies any infectious symptoms or sick contacts at home.  He reports he did not take his medications this morning.  Per his medication list last updated 10 August he is prescribed Plavix at home and baby aspirin to take daily.  Cath report 8/14:   Prox LAD to Mid LAD lesion is 20% stenosed.  2nd Diag lesion is 70% stenosed.  Mid LAD-1 lesion is 20% stenosed.  Mid LAD-2 lesion is 80% stenosed.  A drug-eluting stent was successfully placed using a STENT SYNERGY DES 2.5X38.  Post intervention, there is a 0% residual stenosis.  2nd Mrg lesion is 100% stenosed. 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  A drug-eluting stent was successfully placed using a STENT SYNERGY DES 2.25X24.  Post intervention, there is a 0% residual stenosis.  Moderate RCA disease. Severe distal branch vessel disease. Not amenable to PCI.  RPDA lesion is 70% stenosed.  2nd RPL lesion is 80% stenosed.  LV end diastolic pressure is normal.  There is no aortic valve stenosis.    HPI  Past Medical History:   Diagnosis Date  . CHF (congestive heart failure), NYHA class I (HCC) 2005   patient  . Diabetes (HCC) 2018   Patient  . MI (myocardial infarction) (HCC) 2015   Patient  . Smoker 1987   Source    Patient Active Problem List   Diagnosis Date Noted  . Weight loss 03/07/2019  . Anxiety disorder 03/07/2019  . Influenza vaccination declined 03/07/2019  . Essential hypertension 03/07/2019  . Chronic systolic congestive heart failure (HCC) 03/07/2019  . Tobacco dependence 03/07/2019  . Lung nodule 02/23/2019  . Acute CHF (congestive heart failure) (HCC) 02/19/2019  . 3-vessel CAD   . Coronary artery disease   . Ischemic cardiomyopathy   . Protein-calorie malnutrition, severe 02/05/2019  . Empyema (HCC)   . Parapneumonic effusion 02/04/2019    Past Surgical History:  Procedure Laterality Date  . CORONARY STENT INTERVENTION N/A 02/20/2019   Procedure: CORONARY STENT INTERVENTION;  Surgeon: Corky CraftsVaranasi, Jayadeep S, MD;  [facility];  Service: Cardiovascular;  Laterality: N/A;  . LEFT HEART CATH AND CORONARY ANGIOGRAPHY N/A 02/20/2019   Procedure: LEFT HEART CATH AND CORONARY ANGIOGRAPHY;  Surgeon: Corky CraftsVaranasi, Jayadeep S, MD;  [facility];  Service: Cardiovascular;  Laterality: N/A;  . RIGHT/LEFT HEART CATH AND CORONARY ANGIOGRAPHY N/A 02/06/2019   Procedure: RIGHT/LEFT HEART CATH AND CORONARY ANGIOGRAPHY;  Surgeon: Lennette BihariKelly, Thomas A, MD;  Location: [facility] INVASIVE CV LAB;  Service: Cardiovascular;  Laterality: N/A;  Home Medications    Prior to Admission medications   Medication Sig Start Date End Date Taking? Authorizing Provider  aspirin 81 MG chewable tablet Chew 1 tablet (81 mg total) by mouth daily. 03/05/19   Marcine MatarJohnson, Deborah B, MD  atorvastatin (LIPITOR) 40 MG tablet Take 1 tablet (40 mg total) by mouth daily at 6 PM. 03/05/19   Marcine MatarJohnson, Deborah B, MD  busPIRone (BUSPAR) 5 MG tablet Take 1 tablet (5 mg total) by mouth 2 (two) times daily. For  anxiety 03/05/19   Marcine MatarJohnson, Deborah B, MD  clopidogrel (PLAVIX) 75 MG tablet Take 1 tablet (75 mg total) by mouth daily. 03/05/19   Marcine MatarJohnson, Deborah B, MD  furosemide (LASIX) 20 MG tablet Take 1 tablet (20 mg total) by mouth daily. 03/05/19   Marcine MatarJohnson, Deborah B, MD  glipiZIDE (GLUCOTROL) 5 MG tablet Take 1 tablet (5 mg total) by mouth 2 (two) times daily. Patient not taking: Reported on 03/08/2019 03/05/19 03/04/20  Marcine MatarJohnson, Deborah B, MD  isosorbide mononitrate (IMDUR) 30 MG 24 hr tablet Take 1 tablet (30 mg total) by mouth daily. 03/05/19   Marcine MatarJohnson, Deborah B, MD  losartan (COZAAR) 100 MG tablet Take 1 tablet (100 mg total) by mouth daily. 03/05/19   Marcine MatarJohnson, Deborah B, MD  metFORMIN (GLUCOPHAGE) 500 MG tablet Take 1 tablet (500 mg total) by mouth 2 (two) times daily with a meal. Patient not taking: Reported on 03/08/2019 03/05/19   Marcine MatarJohnson, Deborah B, MD  metoprolol succinate (TOPROL-XL) 100 MG 24 hr tablet Take 1 tablet (100 mg total) by mouth daily. Take with or immediately following a meal. 03/05/19   Marcine MatarJohnson, Deborah B, MD  nitroGLYCERIN (NITROSTAT) 0.4 MG SL tablet Place 0.4 mg under the tongue every 5 (five) minutes as needed for chest pain.    [provider]    Family History Family History  Problem Relation Age of Onset  . CAD Mother   . Hypertension Mother     Social History Social History   Tobacco Use  . Smoking status: Former Smoker    Packs/day: 0.50    Years: 30.00    Pack years: 15.00    Quit date: 02/02/2019    Years since quitting: 0.1  . Smokeless tobacco: Never Used  . Tobacco comment: Patient states he quit, but he will take nicotine patch  Substance Use Topics  . Alcohol use: Not Currently  . Drug use: Yes    Frequency: 2.0 times per week    Types: Marijuana     Allergies   Patient has no known allergies.   Review of Systems Review of Systems  Constitutional: Negative for chills and fever.  Respiratory: Positive for chest tightness and shortness of  breath. Negative for cough.   Cardiovascular: Negative for chest pain and palpitations.  Gastrointestinal: Negative for nausea and vomiting.  Musculoskeletal: Negative for arthralgias and back pain.  Skin: Negative for pallor and rash.  Neurological: Negative for seizures and syncope.  All other systems reviewed and are negative.    Physical Exam Updated Vital Signs BP (!) 158/106   Pulse 89   Temp 97.8 F (36.6 C) (Oral)   Resp (!) 28   Ht 5\' 6"  (1.676 m)   Wt 54.4 kg   SpO2 (!) 70%   BMI 19.37 kg/m   Physical Exam Vitals signs and nursing note reviewed.  Constitutional:      Appearance: He is well-developed.  HENT:     Head: Normocephalic and atraumatic.  Eyes:  Conjunctiva/sclera: Conjunctivae normal.  Neck:     Musculoskeletal: Normal range of motion.  Cardiovascular:     Rate and Rhythm: Normal rate and regular rhythm.     Heart sounds: No murmur.  Pulmonary:     Effort: Pulmonary effort is normal. No respiratory distress.     Breath sounds: Examination of the right-lower field reveals rales. Examination of the left-lower field reveals rales. Rales present. No wheezing.  Abdominal:     Palpations: Abdomen is soft.     Tenderness: There is no abdominal tenderness.  Musculoskeletal:     Right lower leg: No edema.     Left lower leg: No edema.  Skin:    General: Skin is warm and dry.  Neurological:     General: No focal deficit present.     Mental Status: He is alert and oriented to person, place, and time.  Psychiatric:        Mood and Affect: Mood normal.        Behavior: Behavior normal.      ED Treatments / Results  Labs (all labs ordered are listed, but only abnormal results are displayed) Labs Reviewed  BASIC METABOLIC PANEL - Abnormal; Notable for the following components:      Result Value   Glucose, Bld 348 (*)    BUN 40 (*)    All other components within normal limits  CBC - Abnormal; Notable for the following components:   RDW 17.8  (*)    All other components within normal limits  TROPONIN I (HIGH SENSITIVITY) - Abnormal; Notable for the following components:   Troponin I (High Sensitivity) 104 (*)    All other components within normal limits  TROPONIN I (HIGH SENSITIVITY) - Abnormal; Notable for the following components:   Troponin I (High Sensitivity) 92 (*)    All other components within normal limits  SARS CORONAVIRUS 2 (HOSPITAL ORDER, PERFORMED IN [place] [facility])  BRAIN NATRIURETIC PEPTIDE    EKG EKG Interpretation  Date/Time:  Tuesday March 13 2019 16:22:59 EDT Ventricular Rate:  88 PR Interval:    QRS Duration: 95 QT Interval:  386 QTC Calculation: 467 R Axis:   16 Text Interpretation:  Sinus rhythm Atrial premature complex Probable left atrial enlargement LVH with secondary repolarization abnormality Isolated ST elevation in V3, does not meet STEMI criteria Confirmed by Alvester Chou [phone]) on 03/13/2019 4:26:08 PM   Radiology Dg Chest 2 View  Result Date: 03/13/2019 CLINICAL DATA:  Chest pain. EXAM: CHEST - 2 VIEW COMPARISON:  02/20/2019 and 02/19/2019 FINDINGS: Persistent cardiomegaly. Pulmonary vascularity is normal. Small persistent right pleural effusion with lateral pleural thickening. Irregular area of residual scarring at the right lung base, slightly less prominent. Tiny residual left effusion. No discrete abnormalities of the left lung. No significant bone abnormality. IMPRESSION: Persistent small right pleural effusion with scarring at the right lung base. Tiny residual left effusion. Persistent cardiomegaly. No acute findings. Electronically Signed   By: Francene Boyers M.D.   On: 03/13/2019 13:29    Procedures Procedures (including critical care time)  Medications Ordered in ED Medications  sodium chloride flush (NS) 0.9 % injection 3 mL (3 mLs Intravenous Given 03/13/19 1644)  aspirin chewable tablet 324 mg (324 mg Oral Given 03/13/19 1635)  LORazepam (ATIVAN) injection 1 mg  (1 mg Intravenous Given 03/13/19 1635)     Initial Impression / Assessment and Plan / ED Course  I have reviewed the triage vital signs and the nursing notes.  Pertinent labs & imaging results that were available during my care of the patient were reviewed by me and considered in my medical decision making (see chart for details).  53 year old male presented emergency department progressively worsening shortness of breath since discharge from hospitalization last month, at which time he was treated for empyema of the lung and had 2 stents placed for coronary artery disease.  He appears comfortable on exam is not hypoxic tachycardic or tachypneic.  Differential includes ACS versus pneumonia versus pneumothorax versus congestive heart failure exacerbation versus other  I have a lower suspicion for pulmonary embolism without tachycardia or hypoxia, and given my greater concern for possible stent reocclusion or cardiac ischemia.  EKG troponins are pending will give full dose aspirin here.  Clinical Course as of Mar 13 1947  Tue Mar 13, 2019  1625 IMPRESSION: Persistent small right pleural effusion with scarring at the right lung base. Tiny residual left effusion. Persistent cardiomegaly. No acute findings.   [MT]  1626 EKG with isolated ST elevation in V3, T wave inversions in V5-V6 seen on prior EKG, does not meet STEMi criteria   [MT]  1627 Will repeat trop, discuss with cardiology   [MT]  1633 No leukocytosis fever tachycardia suggest sepsis or empyema.   [MT]  1858 Paged cardiology group, troponins downtrending   [MT]  1903 I spoke to Dr. Gilman Schmidt the cardiologist who suspects the patient's symptoms are more likely related to CHF, and at this time is not recommending heparin ggt.  We will admit to hospitalist service with cardiology in consultation.   [MT]  1945 Admitted to the hospitalist service.   [MT]  1945 Hypoxia noted per vital sign update, however when I went in to see the  patient the pulse ox was off his finger initially had a poor pleth.  After period of settling in a good pleth the pulse ox was 95% on room air.  The patient complains of no new symptoms.  I believe the hypoxia listed is erroneous reading.   [MT]    Clinical Course User Index [MT] Coumba Kellison, Carola Rhine, MD        Final Clinical Impressions(s) / ED Diagnoses   Final diagnoses:  Shortness of breath    ED Discharge Orders    None       Langston Masker Carola Rhine, MD 03/13/19 [phone]

## 2019-03-13 NOTE — Consult Note (Signed)
Cardiology Consultation:   Patient ID: Mark ChessmanJeffrey Pittman MRN: [id number]; DOB: 09/02/1965  Admit date: 03/13/2019 Date of Consult: 03/13/2019  Primary Care Provider: Marcine MatarJohnson, Mark B, MD Primary Cardiologist: No primary care provider on file.  Primary Electrophysiologist:  None    Patient Profile:   Mark ChessmanJeffrey Pittman is a 53 y.o. male with a hx of 3v CAD s/p recent PCI to LAD and OM2, ICM (EF 25-30%), chronic tobacco use, HTN, DM, HLD, recent pneumonia (parapneumonic effusion, empyema s/p drainage)  who is being seen today for the evaluation of troponin elevation at the request of Dr Mark Rakersrifan.  History of Present Illness:   Mark Pittman is a [age] yom with a history of 3v CAD s/p recent PCI to LAD and OM2, ICM (EF 25-30%), chronic tobacco use, HTN, DM, HLD, recent pneumonia (parapneumonic effusion, empyema s/p drainage) who presents with dyspnea.  He was admitted from 8/2-02/08/19.  Had presented with dyspnea and chest pain.  [facility] was [facility] where he had an abnormal Lexiscan.  He also was found to have a low-grade fever with loculated pleural effusion.  Echo showed new systolic heart failure, EF 25-30 %.  [facility] was [facility], and underwent chest tube placement and completed treatment for pneumonia.  Cardiac cath was done on 8/4 and showed severe 3 vessel disease: 80% mLAD, 70% diffuse D2, 99% OM2, 50% mid-distal RCA, 70% PDA, 80% RPL.  Initial plan was for medical therapy.  Presented back to the ED on 02/19/2019 with chest pain.  Was taken back to the Cath Lab and underwent DES x1 to mLAD and DES to OM2.  Medical therapy planned for RCA.  Started on DAPT with ASA/plavix.  He presents today with dyspnea.  Reports that he has not had any chest pain since his stent placement.  However, has had worsening dyspnea prompting him to come to the ED today.  He reports that he has gained 10 pounds since he left the hospital.  In the ED, BP 167/124, did not take his medications today.   HsTn 104 ->90.  Cr 1.13.  Hgn 14.9.  COVID negative.  CXR with small right pleural effusion.   EKG with sinus rhythm, LVH with repolarization changes.   Heart Pathway Score:     Past Medical History:  Diagnosis Date   CHF (congestive heart failure), NYHA class I (HCC) 2005   patient   Diabetes (HCC) 2018   Patient   MI (myocardial infarction) [facility](HCC) 2015   Patient   Smoker 1987   Source    Past Surgical History:  Procedure Laterality Date   CORONARY STENT INTERVENTION N/A 02/20/2019   Procedure: CORONARY STENT INTERVENTION;  Surgeon: Corky CraftsVaranasi, Jayadeep S, MD;  Location: [facility] INVASIVE CV LAB;  Service: Cardiovascular;  Laterality: N/A;   LEFT HEART CATH AND CORONARY ANGIOGRAPHY N/A 02/20/2019   Procedure: LEFT HEART CATH AND CORONARY ANGIOGRAPHY;  Surgeon: Corky CraftsVaranasi, Jayadeep S, MD;  [facility];  Service: Cardiovascular;  Laterality: N/A;   RIGHT/LEFT HEART CATH AND CORONARY ANGIOGRAPHY N/A 02/06/2019   Procedure: RIGHT/LEFT HEART CATH AND CORONARY ANGIOGRAPHY;  Surgeon: Lennette BihariKelly, Thomas A, MD;  Location: [facility] INVASIVE CV LAB;  Service: Cardiovascular;  Laterality: N/A;       Inpatient Medications: Scheduled Meds:  Continuous Infusions:  PRN Meds:   Allergies:   No Known Allergies  Social History:   Social History   Socioeconomic History   Marital status: Single    Spouse name: Not on file  Number of children: Not on file   Years of education: Not on file   Highest education level: Not on file  Occupational History   Not on file  Social Needs   Financial resource strain: Not on file   Food insecurity    Worry: Not on file    Inability: Not on file   Transportation needs    Medical: Not on file    Non-medical: Not on file  Tobacco Use   Smoking status: Former Smoker    Packs/day: 0.50    Years: 30.00    Pack years: 15.00    Quit date: 02/02/2019    Years since quitting: 0.1   Smokeless tobacco: Never Used   Tobacco comment: Patient  states he quit, but he will take nicotine patch  Substance and Sexual Activity   Alcohol use: Not Currently   Drug use: Yes    Frequency: 2.0 times per week    Types: Marijuana   Sexual activity: Not Currently    Partners: Male  Lifestyle   Physical activity    Days per week: Not on file    Minutes per session: Not on file   Stress: Not on file  Relationships   Social connections    Talks on phone: Not on file    Gets together: Not on file    Attends religious service: Not on file    Active member of club or organization: Not on file    Attends meetings of clubs or organizations: Not on file    Relationship status: Not on file   Intimate partner violence    Fear of current or ex partner: Not on file    Emotionally abused: Not on file    Physically abused: Not on file    Forced sexual activity: Not on file  Other Topics Concern   Not on file  Social History Narrative   Not on file    Family History:    Family History  Problem Relation Age of Onset   CAD Mother    Hypertension Mother      ROS:  Please see the history of present illness.  All other ROS reviewed and negative.     Physical Exam/Data:   Vitals:   03/13/19 1221 03/13/19 1630 03/13/19 1642  BP: (!) 167/124 (!) 172/117   Pulse: 82 88   Resp: 18 (!) 24   Temp: 97.8 F (36.6 C)    TempSrc: Oral    SpO2: 100% 96%   Weight:   54.4 kg  Height:   5\' 6"  (1.676 m)   No intake or output data in the 24 hours ending 03/13/19 1905 Last 3 Weights 03/13/2019 03/08/2019 03/05/2019  Weight (lbs) 120 lb 118 lb 6.4 oz 120 lb  Weight (kg) 54.432 kg 53.706 kg 54.432 kg     Body mass index is 19.37 kg/m.  General:  Increased work of breathing but in no acute distress HEENT: normal Lymph: no adenopathy Neck: + JVD Endocrine:  No thryomegaly Vascular: No carotid bruits; FA pulses 2+ bilaterally without bruits  Cardiac:  normal S1, S2; irregular, normal rate; no murmur  Lungs:  Bibasilar crackles Abd:  soft, nontender, no hepatomegaly  Ext: no edema Musculoskeletal:  No deformities Skin: warm and dry  Neuro:  CNs 2-12 intact, no focal abnormalities noted Psych:  Normal affect   EKG:  The EKG was personally reviewed and demonstrates:  sinus rhythm, LVH with repolarization changes. ST depressions II, III Telemetry:  Telemetry  was personally reviewed and demonstrates:  Sinus rhythm with PACS, rates 90s  Relevant CV Studies: TTE 02/05/19: 1. The left ventricle is normal in size with severely reduced systolic function, with an ejection fraction of 25-30%. There is moderately increased left ventricular wall thickness. Left ventricular diastolic Doppler parameters are consistent with  pseudonormalization. Left ventricular diffuse hypokinesis, appeared worse in the inferolateral wall.  2. The RV was normal in size with mildly decreased systolic function.  3. Left atrial size was moderately dilated.  4. No evidence of mitral valve stenosis. Trivial mitral regurgitation.  5. The aortic valve is tricuspid. Aortic valve regurgitation is mild by color flow Doppler. No stenosis of the aortic valve.  6. The inferior vena cava was dilated in size with >50% respiratory variability. PA systolic pressure 29 mmHg.  7. The aorta is abnormal in size and structure.  8. There is mild dilatation of the ascending aorta measuring 40 mm.  Cath 02/06/19:   Significant three-vessel coronary obstructive disease with evidence for coronary calcification and 20% proximal to mid LAD stenosis followed by 50 and 80% mid stenoses with diffuse 70% stenosis in a bifurcating second diagonal branch involving both branches of the LAD; subtotal 99% stenosis of the second marginal vessel with evidence for retrograde collateralization via the apical LAD system; diffusely irregular RCA with 20% mid 50 and 70% mid distal stenoses and diffuse 70% proximal PDA stenosis with diffuse 80% inferior LV proximal to mid stenosis.  Cath  02/20/19:  Prox LAD to Mid LAD lesion is 20% stenosed.  2nd Diag lesion is 70% stenosed.  Mid LAD-1 lesion is 20% stenosed.  Mid LAD-2 lesion is 80% stenosed.  A drug-eluting stent was successfully placed using a STENT SYNERGY DES 2.5X38.  Post intervention, there is a 0% residual stenosis.  2nd Mrg lesion is 100% stenosed. 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  A drug-eluting stent was successfully placed using a STENT SYNERGY DES 2.25X24.  Post intervention, there is a 0% residual stenosis.  Moderate RCA disease. Severe distal branch vessel disease. Not amenable to PCI.  RPDA lesion is 70% stenosed.  2nd RPL lesion is 80% stenosed.  LV end diastolic pressure is normal.  There is no aortic valve stenosis.   Continue aggressive secondary prevention.  Will need to continue CHF meds and repeat echo in 6-8 weeks to see if LV function has improved.    Laboratory Data:  High Sensitivity Troponin:   Recent Labs  Lab 02/19/19 1617 02/19/19 1800 03/13/19 1249 03/13/19 1729  TROPONINIHS 35* 35* 104* 92*     Chemistry Recent Labs  Lab 03/08/19 0900 03/13/19 1249  NA 138 139  K 4.1 4.4  CL 96 103  CO2 19* 24  GLUCOSE 355* 348*  BUN 44* 40*  CREATININE 1.11 1.13  CALCIUM 10.0 9.5  GFRNONAA 75 >60  GFRAA 87 >60  ANIONGAP  --  12    Recent Labs  Lab 03/08/19 0900  PROT 7.2  ALBUMIN 4.0  AST 19  ALT 27  ALKPHOS 192*  BILITOT 1.0   Hematology Recent Labs  Lab 03/13/19 1249  WBC 8.8  RBC 4.69  HGB 14.9  HCT 46.3  MCV 98.7  MCH 31.8  MCHC 32.2  RDW 17.8*  PLT 228   BNPNo results for input(s): BNP, PROBNP in the last 168 hours.  DDimer No results for input(s): DDIMER in the last 168 hours.   Radiology/Studies:  Dg Chest 2 View  Result  Date: 03/13/2019 CLINICAL DATA:  Chest pain. EXAM: CHEST - 2 VIEW COMPARISON:  02/20/2019 and 02/19/2019 FINDINGS: Persistent cardiomegaly. Pulmonary vascularity is normal. Small  persistent right pleural effusion with lateral pleural thickening. Irregular area of residual scarring at the right lung base, slightly less prominent. Tiny residual left effusion. No discrete abnormalities of the left lung. No significant bone abnormality. IMPRESSION: Persistent small right pleural effusion with scarring at the right lung base. Tiny residual left effusion. Persistent cardiomegaly. No acute findings. Electronically Signed   By: Lorriane Shire M.D.   On: 03/13/2019 13:29      Assessment and Plan:  53 y.o. male with a hx of 3v CAD s/p recent PCI to LAD and OM2, ICM (EF 25-30%), chronic tobacco use, HTN, DM, HLD, recent pneumonia (parapneumonic effusion, empyema s/p drainage) p/w acute on chronic systolic heart failure  Acute on chronic systolic heart failure: EF 25-30%, presented with worsening dyspnea and weight gain.  +JVD, bibasilar crackles on exam.   - Check BNP - IV lasix 40 mg BID - Continue losartan 100 mg and toprol XL 100 mg daily  Elevated troponin: mild elevation (104 ->92).  Suspect demand ischemia in setting of decompensated heart failure as above.  States that he has had no chest pain since PCI last month    CAD: severe 3V disease s/p recent PCI to LAD and OM2.  Medical management of severe RCA disease.  Denies chest pain - Continue ASA, plavix, statin, metoprolol, imdur.  Reports compliance with his meds, but did not take meds today, will give ASA/plavix tonight  HTN: uncontrolled in ED, but did not take his meds today - Restart home toprol XL, losartan, and imdur - If remains uncontrolled on home meds, can add hydralazine  HLD: continue atorvastatin 40 mg daily    For questions or updates, please contact [place] Please consult [email or web address] for contact info under     Signed, Donato Heinz, MD  03/13/2019 7:05 PM

## 2019-03-13 NOTE — ED Notes (Signed)
Attempted blood draw x 2  

## 2019-03-14 DIAGNOSIS — R918 Other nonspecific abnormal finding of lung field: Secondary | ICD-10-CM | POA: Diagnosis present

## 2019-03-14 DIAGNOSIS — Z955 Presence of coronary angioplasty implant and graft: Secondary | ICD-10-CM | POA: Diagnosis not present

## 2019-03-14 DIAGNOSIS — I5021 Acute systolic (congestive) heart failure: Secondary | ICD-10-CM

## 2019-03-14 DIAGNOSIS — Z20828 Contact with and (suspected) exposure to other viral communicable diseases: Secondary | ICD-10-CM | POA: Diagnosis not present

## 2019-03-14 DIAGNOSIS — Z7982 Long term (current) use of aspirin: Secondary | ICD-10-CM | POA: Diagnosis not present

## 2019-03-14 DIAGNOSIS — I255 Ischemic cardiomyopathy: Secondary | ICD-10-CM

## 2019-03-14 DIAGNOSIS — Z8701 Personal history of pneumonia (recurrent): Secondary | ICD-10-CM | POA: Diagnosis not present

## 2019-03-14 DIAGNOSIS — I5023 Acute on chronic systolic (congestive) heart failure: Secondary | ICD-10-CM | POA: Diagnosis not present

## 2019-03-14 DIAGNOSIS — F419 Anxiety disorder, unspecified: Secondary | ICD-10-CM | POA: Diagnosis present

## 2019-03-14 DIAGNOSIS — Z7902 Long term (current) use of antithrombotics/antiplatelets: Secondary | ICD-10-CM | POA: Diagnosis not present

## 2019-03-14 DIAGNOSIS — R64 Cachexia: Secondary | ICD-10-CM | POA: Diagnosis not present

## 2019-03-14 DIAGNOSIS — I248 Other forms of acute ischemic heart disease: Secondary | ICD-10-CM | POA: Diagnosis not present

## 2019-03-14 DIAGNOSIS — E1151 Type 2 diabetes mellitus with diabetic peripheral angiopathy without gangrene: Secondary | ICD-10-CM | POA: Diagnosis not present

## 2019-03-14 DIAGNOSIS — Z681 Body mass index (BMI) 19 or less, adult: Secondary | ICD-10-CM | POA: Diagnosis not present

## 2019-03-14 DIAGNOSIS — E1165 Type 2 diabetes mellitus with hyperglycemia: Secondary | ICD-10-CM | POA: Diagnosis not present

## 2019-03-14 DIAGNOSIS — E876 Hypokalemia: Secondary | ICD-10-CM | POA: Diagnosis not present

## 2019-03-14 DIAGNOSIS — I110 Hypertensive heart disease with heart failure: Secondary | ICD-10-CM | POA: Diagnosis not present

## 2019-03-14 DIAGNOSIS — E440 Moderate protein-calorie malnutrition: Secondary | ICD-10-CM | POA: Diagnosis not present

## 2019-03-14 DIAGNOSIS — F17210 Nicotine dependence, cigarettes, uncomplicated: Secondary | ICD-10-CM | POA: Diagnosis present

## 2019-03-14 DIAGNOSIS — I2510 Atherosclerotic heart disease of native coronary artery without angina pectoris: Secondary | ICD-10-CM | POA: Diagnosis not present

## 2019-03-14 DIAGNOSIS — Z79899 Other long term (current) drug therapy: Secondary | ICD-10-CM | POA: Diagnosis not present

## 2019-03-14 DIAGNOSIS — I252 Old myocardial infarction: Secondary | ICD-10-CM | POA: Diagnosis not present

## 2019-03-14 DIAGNOSIS — E785 Hyperlipidemia, unspecified: Secondary | ICD-10-CM | POA: Diagnosis present

## 2019-03-14 DIAGNOSIS — R0602 Shortness of breath: Secondary | ICD-10-CM | POA: Diagnosis present

## 2019-03-14 DIAGNOSIS — I493 Ventricular premature depolarization: Secondary | ICD-10-CM | POA: Diagnosis present

## 2019-03-14 DIAGNOSIS — Z8249 Family history of ischemic heart disease and other diseases of the circulatory system: Secondary | ICD-10-CM | POA: Diagnosis not present

## 2019-03-14 LAB — BASIC METABOLIC PANEL
Anion gap: 11 (ref 5–15)
BUN: 34 mg/dL — ABNORMAL HIGH (ref 6–20)
CO2: 26 mmol/L (ref 22–32)
Calcium: 8.9 mg/dL (ref 8.9–10.3)
Chloride: 105 mmol/L (ref 98–111)
Creatinine, Ser: 0.91 mg/dL (ref 0.61–1.24)
GFR calc Af Amer: >60 mL/min (ref >60)
GFR calc non Af Amer: >60 mL/min (ref >60)
Glucose, Bld: 304 mg/dL — ABNORMAL HIGH (ref 70–99)
Potassium: 3.3 mmol/L — ABNORMAL LOW (ref 3.5–5.1)
Sodium: 142 mmol/L (ref 135–145)

## 2019-03-14 LAB — MAGNESIUM: Magnesium: 1.9 mg/dL (ref 1.7–2.4)

## 2019-03-14 LAB — CBC
HCT: 40 % (ref 39.0–52.0)
Hemoglobin: 13.4 g/dL (ref 13.0–17.0)
MCH: 32.1 pg (ref 26.0–34.0)
MCHC: 33.5 g/dL (ref 30.0–36.0)
MCV: 95.7 fL (ref 80.0–100.0)
Platelets: 198 10*3/uL (ref 150–400)
RBC: 4.18 MIL/uL — ABNORMAL LOW (ref 4.22–5.81)
RDW: 17.7 % — ABNORMAL HIGH (ref 11.5–15.5)
WBC: 9.7 10*3/uL (ref 4.0–10.5)
nRBC: 0 % (ref 0.0–0.2)

## 2019-03-14 LAB — GLUCOSE, CAPILLARY
Glucose-Capillary: 313 mg/dL — ABNORMAL HIGH (ref 70–99)
Glucose-Capillary: 283 mg/dL — ABNORMAL HIGH (ref 70–99)
Glucose-Capillary: 311 mg/dL — ABNORMAL HIGH (ref 70–99)
Glucose-Capillary: 114 mg/dL — ABNORMAL HIGH (ref 70–99)

## 2019-03-14 LAB — TSH: TSH: 3.528 u[IU]/mL (ref 0.350–4.500)

## 2019-03-14 MED ORDER — CLONAZEPAM 0.5 MG PO TABS
1.0000 mg | ORAL_TABLET | Freq: Two times a day (BID) | ORAL | Status: DC | PRN
  Administered 2019-03-14 – 2019-03-15 (×3): 1 mg via ORAL
  Filled 2019-03-14 (×3): qty 2

## 2019-03-14 MED ORDER — POTASSIUM CHLORIDE CRYS ER 20 MEQ PO TBCR
40.0000 meq | EXTENDED_RELEASE_TABLET | Freq: Two times a day (BID) | ORAL | Status: DC
  Administered 2019-03-14 – 2019-03-16 (×5): 40 meq via ORAL
  Filled 2019-03-14 (×5): qty 2

## 2019-03-14 MED ORDER — INSULIN GLARGINE 100 UNIT/ML ~~LOC~~ SOLN
10.0000 [IU] | Freq: Every day | SUBCUTANEOUS | Status: DC
  Administered 2019-03-14 – 2019-03-16 (×3): 10 [IU] via SUBCUTANEOUS
  Filled 2019-03-14 (×3): qty 0.1

## 2019-03-14 MED ORDER — HYDROXYZINE HCL 10 MG PO TABS
10.0000 mg | ORAL_TABLET | Freq: Three times a day (TID) | ORAL | Status: DC | PRN
  Administered 2019-03-14 – 2019-03-15 (×3): 10 mg via ORAL
  Filled 2019-03-14 (×3): qty 1

## 2019-03-14 MED ORDER — FUROSEMIDE 10 MG/ML IJ SOLN
80.0000 mg | Freq: Two times a day (BID) | INTRAMUSCULAR | Status: DC
  Administered 2019-03-14 – 2019-03-16 (×4): 80 mg via INTRAVENOUS
  Filled 2019-03-14 (×4): qty 8

## 2019-03-14 NOTE — Progress Notes (Addendum)
Progress Note  Patient Name: Mark Pittman Date of Encounter: 03/14/2019  Primary Cardiologist: Lance Muss, MD   Subjective   Patient states his breathing is better but still not at baseline. Denies chest pain.   Inpatient Medications    Scheduled Meds:  aspirin EC  81 mg Oral Daily   atorvastatin  40 mg Oral q1800   clopidogrel  75 mg Oral Daily   enoxaparin (LOVENOX) injection  30 mg Subcutaneous Q24H   feeding supplement (ENSURE ENLIVE)  237 mL Oral BID BM   furosemide  40 mg Intravenous BID   insulin aspart  0-9 Units Subcutaneous TID WC   isosorbide mononitrate  30 mg Oral Daily   losartan  100 mg Oral Daily   metoprolol succinate  100 mg Oral Daily   Continuous Infusions:  PRN Meds: acetaminophen **OR** acetaminophen, nitroGLYCERIN, ondansetron **OR** ondansetron (ZOFRAN) IV   Vital Signs    Vitals:   03/13/19 2146 03/13/19 2224 03/14/19 0259 03/14/19 0831  BP: (!) 144/105 (!) 161/118 (!) 150/99 132/90  Pulse: 86 93 83 94  Resp: 12  18 18   Temp:  98.1 F (36.7 C) 98 F (36.7 C) 99.3 F (37.4 C)  TempSrc:  Oral Oral Oral  SpO2: 97% 99% 96% 96%  Weight:  54.4 kg 52.7 kg   Height:  5\' 6"  (1.676 m)      Intake/Output Summary (Last 24 hours) at 03/14/2019 0834 Last data filed at 03/14/2019 0630 Gross per 24 hour  Intake 120 ml  Output 300 ml  Net -180 ml   Last 3 Weights 03/14/2019 03/13/2019 03/13/2019  Weight (lbs) 116 lb 3.2 oz 120 lb 120 lb  Weight (kg) 52.708 kg 54.432 kg 54.432 kg      Telemetry    NSR, HR 80-90, frequent PVCs - Personally Reviewed  ECG    No new - Personally Reviewed  Physical Exam   GEN: No acute distress.   Neck: +JVD (moderate) Cardiac: RRR, no murmurs, rubs, or gallops.  Respiratory: B/L crackles R>L. GI: Soft, nontender, non-distended  MS: No edema; No deformity. Neuro:  Nonfocal  Psych: Normal affect   Labs    High Sensitivity Troponin:   Recent Labs  Lab 02/19/19 1617 02/19/19 1800  03/13/19 1249 03/13/19 1729  TROPONINIHS 35* 35* 104* 92*      Chemistry Recent Labs  Lab 03/08/19 0900 03/13/19 1249 03/14/19 0515  NA 138 139 142  K 4.1 4.4 3.3*  CL 96 103 105  CO2 19* 24 26  GLUCOSE 355* 348* 304*  BUN 44* 40* 34*  CREATININE 1.11 1.13 0.91  CALCIUM 10.0 9.5 8.9  PROT 7.2  --   --   ALBUMIN 4.0  --   --   AST 19  --   --   ALT 27  --   --   ALKPHOS 192*  --   --   BILITOT 1.0  --   --   GFRNONAA 75 >60 >60  GFRAA 87 >60 >60  ANIONGAP  --  12 11     Hematology Recent Labs  Lab 03/13/19 1249 03/14/19 0515  WBC 8.8 9.7  RBC 4.69 4.18*  HGB 14.9 13.4  HCT 46.3 40.0  MCV 98.7 95.7  MCH 31.8 32.1  MCHC 32.2 33.5  RDW 17.8* 17.7*  PLT 228 198    BNP Recent Labs  Lab 03/13/19 1930  BNP >4,500.0*     DDimer No results for input(s): DDIMER in the last 168 hours.  Radiology    Dg Chest 2 View  Result Date: 03/13/2019 CLINICAL DATA:  Chest pain. EXAM: CHEST - 2 VIEW COMPARISON:  02/20/2019 and 02/19/2019 FINDINGS: Persistent cardiomegaly. Pulmonary vascularity is normal. Small persistent right pleural effusion with lateral pleural thickening. Irregular area of residual scarring at the right lung base, slightly less prominent. Tiny residual left effusion. No discrete abnormalities of the left lung. No significant bone abnormality. IMPRESSION: Persistent small right pleural effusion with scarring at the right lung base. Tiny residual left effusion. Persistent cardiomegaly. No acute findings. Electronically Signed   By: Francene BoyersJames  Maxwell M.D.   On: 03/13/2019 13:29    Cardiac Studies   R/L Heart Cath 02/20/19  Prox LAD to Mid LAD lesion is 20% stenosed.  2nd Diag lesion is 70% stenosed.  Mid LAD-1 lesion is 20% stenosed.  Mid LAD-2 lesion is 80% stenosed.  A drug-eluting stent was successfully placed using a STENT SYNERGY DES 2.5X38.  Post intervention, there is a 0% residual stenosis.  2nd Mrg lesion is 100% stenosed. This was a  chronic total occlusion with left to left collaterals.  A drug-eluting stent was successfully placed using a STENT SYNERGY DES 2.25X24.  Post intervention, there is a 0% residual stenosis.  Moderate RCA disease. Severe distal branch vessel disease. Not amenable to PCI.  RPDA lesion is 70% stenosed.  2nd RPL lesion is 80% stenosed.  LV end diastolic pressure is normal.  There is no aortic valve stenosis.   Continue aggressive secondary prevention.  Will need to continue CHF meds and repeat echo in 6-8 weeks to see if LV function has improved.    Echo 02/05/19  1. The left ventricle is normal in size with severely reduced systolic function, with an ejection fraction of 25-30%. There is moderately increased left ventricular wall thickness. Left ventricular diastolic Doppler parameters are consistent with  pseudonormalization. Left ventricular diffuse hypokinesis, appeared worse in the inferolateral wall.  2. The RV was normal in size with mildly decreased systolic function.  3. Left atrial size was moderately dilated.  4. No evidence of mitral valve stenosis. Trivial mitral regurgitation.  5. The aortic valve is tricuspid. Aortic valve regurgitation is mild by color flow Doppler. No stenosis of the aortic valve.  6. The inferior vena cava was dilated in size with >50% respiratory variability. PA systolic pressure 29 mmHg.  7. The aorta is abnormal in size and structure.  8. There is mild dilatation of the ascending aorta measuring 40 mm.  Patient Profile     53 y.o. male  with a hx of 3v CAD s/p recent PCI to LAD and OM2, ICM (EF 25-30%), chronic tobacco use, HTN, DM, HLD, recent pneumonia (parapneumonic effusion, empyema s/p drainage)  who is being seen today for the evaluation of troponin elevation in the setting of acute heart failure.  Assessment & Plan   Acute on chronic systolic heart failure - Patient presented with worsening dyspnea and weight gain.     - Echo on 02/05/19  showed EF 25-30% - BNP >4,5000 - Patient was started on IV lasix 40 mg BID. - So far he is diuresing well and breathing is improving - Weight - 4 lbs - Strict I&Os - Creatinine is stable 1.13 > 0.91 - Continue losartan 100 mg and toprol XL 100 mg daily - Still has crackles and JVD on exam and patient is still a little short of breath. Continue diuresis  Elevated troponin - Mild elevation 35 >104 >92   -  Suspect demand ischemia in setting of decompensated heart failure   - Denies recurrent chest pain since PCI last month    CAD (severe 3V disease s/p recent PCI to LAD and OM2) - Medical management of severe RCA disease.   - Denies chest pain - Reports compliance with his meds - Continue ASA, plavix, statin, metoprolol, imdur    HTN  - Uncontrolled in ED, but did not take his meds - Restarted home toprol XL, losartan, and imdur - Improving, today 132/90  HLD  - Continue atorvastatin 40 mg daily  Hypokalemia - 3.3 - Will Supplement - Not on K+ at baseline  DM2 - per IM - Last A1C 8.7  For questions or updates, please contact [facility] Please consult [email or web address] for contact info under        Signed, Cadence Ninfa Meeker, PA-C  03/14/2019, 8:34 AM     Personally seen and examined. Agree with above.   Still feeling some shortness of breath.  Receiving IV Lasix but we have increased this from 40 to 80 mg twice a day.  I's and O's were not vigorous.  Weights may be more accurate, down 4 pounds.  Crackles still noted at bases.  He is alert.  Seems to hold onto most fluid in his abdomen.  Quite thin otherwise  Lab work reviewed.  BNP greater than 4500.  Assessment and plan  Acute on chronic systolic heart failure-increase Lasix to 80 IV twice a day.  Watch creatinine. Elevated troponin-likely demand ischemia in the setting of heart failure CAD-no chest pain. Diabetes with hypertension- continuing to monitor. Hypokalemia-repleted   Candee Furbish, MD

## 2019-03-14 NOTE — Progress Notes (Signed)
Initial Nutrition Assessment  DOCUMENTATION CODES:   Non-severe (moderate) malnutrition in context of chronic illness  INTERVENTION:   -Ensure Enlive po BID, each supplement provides 350 kcal and 20 grams of protein -MVI with minerals daily -Magic cup BID with meals, each supplement provides 290 kcal and 9 grams of protein  NUTRITION DIAGNOSIS:   Moderate Malnutrition related to chronic illness(CHF) as evidenced by mild fat depletion, moderate fat depletion, mild muscle depletion, moderate muscle depletion.  GOAL:   Patient will meet greater than or equal to 90% of their needs  MONITOR:   PO intake, Supplement acceptance, Labs, Weight trends, Skin, I & O's  REASON FOR ASSESSMENT:   Malnutrition Screening Tool    ASSESSMENT:   Mark Pittman is a 53 y.o. male with history of systolic CHF last EF measured was 25 to 30% in August 2020 who was admitted last month for empyema had undergone chest tube placement during which patient was found to have a low EF underwent cardiac cath was found to have three-vessel disease was subsequently discharged and readmitted after patient had again chest pain during which patient had stents placed and was discharged on February 21, 2019.  Patient presents to the ER because of progressive shortness of breath with exertion over the last few weeks since discharge.  Denies chest pain.  Denies any fever chills but has gained 10 pounds.  Pt admitted with CHF exacerbation.  Reviewed I/O's: -180 ml x 24 hours  UOP: 300 ml x 24 hours  Pt sleeping soundly and snoring audibly at time of visit. He did not respond to touch or voice.   Observed lunch meal tray, of which pt completed 100%.   Reviewed wt hx; pt has experienced a 3.8% wt loss over the past month. Suspect edema may be masking further weight loss as well as fat and muscle depletions.   Case discussed with RN, who reports that pt has been very agitated and anxious this shift and has requested  several doses of anxiety related medications.   Lab Results  Component Value Date   HGBA1C 8.7 (H) 02/04/2019   PTA DM medications are 1 mg glipizide BID and 500 mg metformin BID.   Labs reviewed: K: 3.3, CBGS: 283-320 (inpatient orders for glycemic control are 0-9 units insulin aspart TID with meals).   NUTRITION - FOCUSED PHYSICAL EXAM:    Most Recent Value  Orbital Region  Mild depletion  Upper Arm Region  Moderate depletion  Thoracic and Lumbar Region  Unable to assess  Buccal Region  Mild depletion  Temple Region  Mild depletion  Clavicle Bone Region  Mild depletion  Clavicle and Acromion Bone Region  Mild depletion  Scapular Bone Region  Mild depletion  Dorsal Hand  Mild depletion  Patellar Region  Moderate depletion  Anterior Thigh Region  Moderate depletion  Posterior Calf Region  Moderate depletion  Edema (RD Assessment)  Mild  Hair  Reviewed  Eyes  Reviewed  Mouth  Reviewed  Skin  Reviewed  Nails  Reviewed       Diet Order:   Diet Order            Diet heart healthy/carb modified Room service appropriate? Yes; Fluid consistency: Thin; Fluid restriction: 1200 mL Fluid  Diet effective now              EDUCATION NEEDS:   No education needs have been identified at this time  Skin:     Last BM:  03/13/19  Height:  Ht Readings from Last 1 Encounters:  03/13/19 5\' 6"  (1.676 m)    Weight:   Wt Readings from Last 1 Encounters:  03/14/19 52.7 kg    Ideal Body Weight:  64.5 kg  BMI:  Body mass index is 18.76 kg/m.  Estimated Nutritional Needs:   Kcal:  1800-2000  Protein:  90-105 grams  Fluid:  1.2 L    Ambera Fedele A. Mayford Knife, RD, LDN, CDCES Registered Dietitian II Certified Diabetes Care and Education Specialist Pager: [phone] After hours Pager: [phone]

## 2019-03-14 NOTE — Progress Notes (Addendum)
Progress Note    Mark Pittman  ELF:[id number] DOB: 11-13-1965  DOA: 03/13/2019 PCP: [name], MD    Brief Narrative:     Medical records reviewed and are as summarized below:  Mark Pittman is an 53 y.o. male  with history of systolic CHF last EF measured was 25 to 30% in August 2020 who was admitted last month for empyema had undergone chest tube placement during which patient was found to have a low EF underwent cardiac cath was found to have three-vessel disease was subsequently discharged and readmitted after patient had again chest pain during which patient had stents placed and was discharged on February 21, 2019.  Patient presents to the ER because of progressive shortness of breath with exertion over the last few weeks since discharge.  Assessment/Plan:   Principal Problem:   Acute systolic CHF (congestive heart failure) (HCC) Active Problems:   Protein-calorie malnutrition, severe   Coronary artery disease   Ischemic cardiomyopathy   Essential hypertension   Type 2 diabetes mellitus with vascular disease ([place])  Acute on chronic systolic heart failure last EF measured was 25 to 30% in August last month  -appreciate cardiology consult -increase lasix IV -I/Os -daily weights  Anxiety -says he is on klonopin at home -per PDMP has not filled since 2019-- will not prescribe upon d/c  Hypokalemia -replete  CAD status post recent stenting  -aspirin, Plavix, beta blockers, and statins -Imdur.  Diabetes mellitus type 2 with hyperglycemia  -sliding scale coverage -holding PO meds -add lantus while in hospital  History of recent empyema has been treated completely presently no symptoms.  History of lung nodules  -followed by pulmonologist.  Protein calorie malnutrition -dietician consult   NEEDS TO [place] IV DIURESIS  Family Communication/Anticipated D/C date and plan/Code Status   DVT prophylaxis: Lovenox  ordered. Code Status: Full Code.  Family Communication:  Disposition Plan: home once diuresed   Medical Consultants:    cardiology  Subjective:   C/o anxiety  Objective:    Vitals:   03/13/19 2224 03/14/19 0259 03/14/19 0831 03/14/19 0905  BP: (!) 161/118 (!) 150/99 132/90 (!) 132/93  Pulse: 93 83 94 91  Resp:  18 18   Temp: 98.1 F (36.7 C) 98 F (36.7 C) 99.3 F (37.4 C)   TempSrc: Oral Oral Oral   SpO2: 99% 96% 96% 97%  Weight: 54.4 kg 52.7 kg    Height: 5\' 6"  (1.676 m)       Intake/Output Summary (Last 24 hours) at 03/14/2019 1106 Last data filed at 03/14/2019 0630 Gross per 24 hour  Intake 120 ml  Output 300 ml  Net -180 ml   Filed Weights   03/13/19 1642 03/13/19 2224 03/14/19 0259  Weight: 54.4 kg 54.4 kg 52.7 kg    Exam: In bed, NAD rrr No increased work of breathing A+OX3  Data Reviewed:   I have personally reviewed following labs and imaging studies:  Labs: Labs show the following:   Basic Metabolic Panel: Recent Labs  Lab 03/08/19 0900 03/13/19 1249 03/14/19 0515  NA 138 139 142  K 4.1 4.4 3.3*  CL 96 103 105  CO2 19* 24 26  GLUCOSE 355* 348* 304*  BUN 44* 40* 34*  CREATININE 1.11 1.13 0.91  CALCIUM 10.0 9.5 8.9  MG  --   --  1.9   GFR Estimated Creatinine Clearance: 70 mL/min (by C-G formula based on SCr of 0.91 mg/dL). Liver Function Tests:  Recent Labs  Lab 03/08/19 0900  AST 19  ALT 27  ALKPHOS 192*  BILITOT 1.0  PROT 7.2  ALBUMIN 4.0   No results for input(s): LIPASE, AMYLASE in the last 168 hours. No results for input(s): AMMONIA in the last 168 hours. Coagulation profile No results for input(s): INR, PROTIME in the last 168 hours.  CBC: Recent Labs  Lab 03/13/19 1249 03/14/19 0515  WBC 8.8 9.7  HGB 14.9 13.4  HCT 46.3 40.0  MCV 98.7 95.7  PLT 228 198   Cardiac Enzymes: No results for input(s): CKTOTAL, CKMB, CKMBINDEX, TROPONINI in the last 168 hours. BNP (last 3 results) No results for input(s):  PROBNP in the last 8760 hours. CBG: Recent Labs  Lab 03/13/19 2227 03/14/19 0445 03/14/19 0620  GLUCAP 320* 313* 283*   D-Dimer: No results for input(s): DDIMER in the last 72 hours. Hgb A1c: No results for input(s): HGBA1C in the last 72 hours. Lipid Profile: No results for input(s): CHOL, HDL, LDLCALC, TRIG, CHOLHDL, LDLDIRECT in the last 72 hours. Thyroid function studies: Recent Labs    03/14/19 0515  TSH 3.528   Anemia work up: No results for input(s): VITAMINB12, FOLATE, FERRITIN, TIBC, IRON, RETICCTPCT in the last 72 hours. Sepsis Labs: Recent Labs  Lab 03/13/19 1249 03/14/19 0515  WBC 8.8 9.7    Microbiology Recent Results (from the past 240 hour(s))  SARS Coronavirus 2 [facility](Hospital order, Performed in [facility] lab) Nasopharyngeal Nasopharyngeal Swab     Status: None   Collection Time: 03/13/19  5:19 PM   Specimen: Nasopharyngeal Swab  Result Value Ref Range Status   SARS Coronavirus 2 NEGATIVE NEGATIVE Final    Comment: (NOTE) If result is NEGATIVE SARS-CoV-2 target nucleic acids are NOT DETECTED. The SARS-CoV-2 RNA is generally detectable in upper and lower  respiratory specimens during the acute phase of infection. The lowest  concentration of SARS-CoV-2 viral copies this assay can detect is 250  copies / mL. A negative result does not preclude SARS-CoV-2 infection  and should not be used as the sole basis for treatment or other  patient management decisions.  A negative result may occur with  improper specimen collection / handling, submission of specimen other  than nasopharyngeal swab, presence of viral mutation(s) within the  areas targeted by this assay, and inadequate number of viral copies  (<250 copies / mL). A negative result must be combined with clinical  observations, patient history, and epidemiological information. If result is POSITIVE SARS-CoV-2 target nucleic acids are DETECTED. The SARS-CoV-2 RNA is generally detectable in  upper and lower  respiratory specimens dur ing the acute phase of infection.  Positive  results are indicative of active infection with SARS-CoV-2.  Clinical  correlation with patient history and other diagnostic information is  necessary to determine patient infection status.  Positive results do  not rule out bacterial infection or co-infection with other viruses. If result is PRESUMPTIVE POSTIVE SARS-CoV-2 nucleic acids MAY BE PRESENT.   A presumptive positive result was obtained on the submitted specimen  and confirmed on repeat testing.  While 2019 novel coronavirus  (SARS-CoV-2) nucleic acids may be present in the submitted sample  additional confirmatory testing may be necessary for epidemiological  and / or clinical management purposes  to differentiate between  SARS-CoV-2 and other Sarbecovirus currently known to infect humans.  If clinically indicated additional testing with an alternate test  methodology [phone]([id number]) is advised. The SARS-CoV-2 RNA is generally  detectable in upper and  lower respiratory sp ecimens during the acute  phase of infection. The expected result is Negative. Fact Sheet for Patients:  BoilerBrush.com.cy Fact Sheet for Healthcare Providers: https://pope.com/ This test is not yet approved or cleared by the [place] FDA and has been authorized for detection and/or diagnosis of SARS-CoV-2 by FDA under an Emergency Use Authorization (EUA).  This EUA will remain in effect (meaning this test can be used) for the duration of the COVID-19 declaration under Section 564(b)(1) of the Act, 21 U.S.C. section 360bbb-3(b)(1), unless the authorization is terminated or revoked sooner. Performed at [facility], [address], [place], [place] [address]     Procedures and diagnostic studies:  Dg Chest 2 View  Result Date: 03/13/2019 CLINICAL DATA:  Chest pain. EXAM: CHEST - 2 VIEW COMPARISON:  02/20/2019  and 02/19/2019 FINDINGS: Persistent cardiomegaly. Pulmonary vascularity is normal. Small persistent right pleural effusion with lateral pleural thickening. Irregular area of residual scarring at the right lung base, slightly less prominent. Tiny residual left effusion. No discrete abnormalities of the left lung. No significant bone abnormality. IMPRESSION: Persistent small right pleural effusion with scarring at the right lung base. Tiny residual left effusion. Persistent cardiomegaly. No acute findings. Electronically Signed   By: Francene Boyers M.D.   On: 03/13/2019 13:29    Medications:    aspirin EC  81 mg Oral Daily   atorvastatin  40 mg Oral q1800   clopidogrel  75 mg Oral Daily   enoxaparin (LOVENOX) injection  30 mg Subcutaneous Q24H   feeding supplement (ENSURE ENLIVE)  237 mL Oral BID BM   furosemide  80 mg Intravenous BID   insulin aspart  0-9 Units Subcutaneous TID WC   insulin glargine  10 Units Subcutaneous Daily   isosorbide mononitrate  30 mg Oral Daily   losartan  100 mg Oral Daily   metoprolol succinate  100 mg Oral Daily   potassium chloride  40 mEq Oral BID   Continuous Infusions:   LOS: 0 days   Joseph Art  [facility]   How to contact the [facility] Attending or Consulting provider 7A - 7P or covering provider during after hours 7P -7A, for this patient?  1. Check the care team in [facility] and look for a) attending/consulting [facility] provider listed and b) the [facility] team listed 2. Log into [email or web address] and use [place]'s universal password to access. If you do not have the password, please contact the hospital operator. 3. Locate the [facility] provider you are looking for under [facility] and page to a number that you can be directly reached. 4. If you still have difficulty reaching the provider, please page the [facility] (Director on Call) for the Hospitalists listed on amion for assistance.  03/14/2019, 11:06 AM

## 2019-03-15 ENCOUNTER — Inpatient Hospital Stay: Admission: RE | Admit: 2019-03-15 | Payer: Self-pay | Source: Ambulatory Visit

## 2019-03-15 ENCOUNTER — Telehealth: Payer: Self-pay | Admitting: Internal Medicine

## 2019-03-15 ENCOUNTER — Ambulatory Visit: Payer: Self-pay | Admitting: Cardiology

## 2019-03-15 DIAGNOSIS — E440 Moderate protein-calorie malnutrition: Secondary | ICD-10-CM

## 2019-03-15 LAB — BASIC METABOLIC PANEL
Anion gap: 10 (ref 5–15)
BUN: 31 mg/dL — ABNORMAL HIGH (ref 6–20)
CO2: 28 mmol/L (ref 22–32)
Calcium: 8.7 mg/dL — ABNORMAL LOW (ref 8.9–10.3)
Chloride: 103 mmol/L (ref 98–111)
Creatinine, Ser: 1.07 mg/dL (ref 0.61–1.24)
GFR calc Af Amer: >60 mL/min (ref >60)
GFR calc non Af Amer: >60 mL/min (ref >60)
Glucose, Bld: 223 mg/dL — ABNORMAL HIGH (ref 70–99)
Potassium: 3.9 mmol/L (ref 3.5–5.1)
Sodium: 141 mmol/L (ref 135–145)

## 2019-03-15 LAB — CBC
HCT: 43.9 % (ref 39.0–52.0)
Hemoglobin: 14.3 g/dL (ref 13.0–17.0)
MCH: 31.5 pg (ref 26.0–34.0)
MCHC: 32.6 g/dL (ref 30.0–36.0)
MCV: 96.7 fL (ref 80.0–100.0)
Platelets: 209 10*3/uL (ref 150–400)
RBC: 4.54 MIL/uL (ref 4.22–5.81)
RDW: 18 % — ABNORMAL HIGH (ref 11.5–15.5)
WBC: 10.3 10*3/uL (ref 4.0–10.5)
nRBC: 0 % (ref 0.0–0.2)

## 2019-03-15 LAB — GLUCOSE, CAPILLARY
Glucose-Capillary: 171 mg/dL — ABNORMAL HIGH (ref 70–99)
Glucose-Capillary: 280 mg/dL — ABNORMAL HIGH (ref 70–99)
Glucose-Capillary: 247 mg/dL — ABNORMAL HIGH (ref 70–99)
Glucose-Capillary: 359 mg/dL — ABNORMAL HIGH (ref 70–99)

## 2019-03-15 MED ORDER — CLONIDINE HCL 0.1 MG PO TABS
0.1000 mg | ORAL_TABLET | Freq: Two times a day (BID) | ORAL | Status: DC
  Administered 2019-03-15 – 2019-03-16 (×3): 0.1 mg via ORAL
  Filled 2019-03-15 (×3): qty 1

## 2019-03-15 MED ORDER — ENOXAPARIN SODIUM 40 MG/0.4ML ~~LOC~~ SOLN
40.0000 mg | SUBCUTANEOUS | Status: DC
  Administered 2019-03-16: 40 mg via SUBCUTANEOUS
  Filled 2019-03-15: qty 0.4

## 2019-03-15 MED ORDER — INSULIN ASPART 100 UNIT/ML ~~LOC~~ SOLN
0.0000 [IU] | Freq: Every day | SUBCUTANEOUS | Status: DC
  Administered 2019-03-15: 5 [IU] via SUBCUTANEOUS

## 2019-03-15 MED ORDER — INSULIN ASPART 100 UNIT/ML ~~LOC~~ SOLN
0.0000 [IU] | Freq: Three times a day (TID) | SUBCUTANEOUS | Status: DC
  Administered 2019-03-15: 17:00:00 3 [IU] via SUBCUTANEOUS
  Administered 2019-03-16: 9 [IU] via SUBCUTANEOUS
  Administered 2019-03-16: 1 [IU] via SUBCUTANEOUS

## 2019-03-15 NOTE — Telephone Encounter (Signed)
Patient mother called stating that she was told by the ED to contact PCP office in order to get home skilled nursing orders. Please follow up.
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
## 2019-03-15 NOTE — Progress Notes (Addendum)
SATURATION QUALIFICATIONS: (This note is used to comply with regulatory documentation for home oxygen)  Patient Saturations on Room Air at Rest = 97%  Patient Saturations on Room Air while Ambulating = 88% - 92%  Patient Saturations on 2 Liters of oxygen while Ambulating = 97%  Please briefly explain why patient needs home oxygen: patient desat while ambulating

## 2019-03-15 NOTE — Progress Notes (Addendum)
Progress Note  Patient Name: Mark ChessmanJeffrey Tebbetts Date of Encounter: 03/15/2019  Primary Cardiologist: Lance MussJayadeep Varanasi, MD   Subjective   Patient says he is feeling groggy this morning. Breathing is slowly improving. Denies chest pain. Pressures elevated this AM.   Inpatient Medications    Scheduled Meds: . aspirin EC  81 mg Oral Daily  . atorvastatin  40 mg Oral q1800  . clopidogrel  75 mg Oral Daily  . enoxaparin (LOVENOX) injection  30 mg Subcutaneous Q24H  . feeding supplement (ENSURE ENLIVE)  237 mL Oral BID BM  . furosemide  80 mg Intravenous BID  . insulin aspart  0-9 Units Subcutaneous TID WC  . insulin glargine  10 Units Subcutaneous Daily  . isosorbide mononitrate  30 mg Oral Daily  . losartan  100 mg Oral Daily  . metoprolol succinate  100 mg Oral Daily  . potassium chloride  40 mEq Oral BID   Continuous Infusions:  PRN Meds: acetaminophen **OR** acetaminophen, clonazePAM, hydrOXYzine, nitroGLYCERIN, ondansetron **OR** ondansetron (ZOFRAN) IV   Vital Signs    Vitals:   03/15/19 0020 03/15/19 0341 03/15/19 0656 03/15/19 0700  BP: (!) 136/98 (!) 155/93 (!) 165/112 (!) 154/97  Pulse: 85 78    Resp: 18 17    Temp: 99.3 F (37.4 C) 98.6 F (37 C)    TempSrc: Oral Oral    SpO2: 92% 98%    Weight:  49.3 kg    Height:        Intake/Output Summary (Last 24 hours) at 03/15/2019 0837 Last data filed at 03/15/2019 0414 Gross per 24 hour  Intake 1164 ml  Output 5470 ml  Net -4306 ml   Last 3 Weights 03/15/2019 03/14/2019 03/13/2019  Weight (lbs) 108 lb 11.2 oz 116 lb 3.2 oz 120 lb  Weight (kg) 49.306 kg 52.708 kg 54.432 kg      Telemetry    NSR, rates 80-90s, occasional PVCs - Personally Reviewed  ECG     NSR, 84 bpm, LVH, LAD nonspecific TW changes lateral leads- Personally Reviewed  Physical Exam   GEN: No acute distress.   Neck: mild JVD Cardiac: RRR, no murmurs, rubs, or gallops.  Respiratory: Crackles right side. GI: Soft, nontender,  non-distended  MS: No edema; No deformity. Neuro:  Nonfocal  Psych: Normal affect   Labs    High Sensitivity Troponin:   Recent Labs  Lab 02/19/19 1617 02/19/19 1800 03/13/19 1249 03/13/19 1729  TROPONINIHS 35* 35* 104* 92*      Chemistry Recent Labs  Lab 03/08/19 0900 03/13/19 1249 03/14/19 0515  NA 138 139 142  K 4.1 4.4 3.3*  CL 96 103 105  CO2 19* 24 26  GLUCOSE 355* 348* 304*  BUN 44* 40* 34*  CREATININE 1.11 1.13 0.91  CALCIUM 10.0 9.5 8.9  PROT 7.2  --   --   ALBUMIN 4.0  --   --   AST 19  --   --   ALT 27  --   --   ALKPHOS 192*  --   --   BILITOT 1.0  --   --   GFRNONAA 75 >60 >60  GFRAA 87 >60 >60  ANIONGAP  --  12 11     Hematology Recent Labs  Lab 03/13/19 1249 03/14/19 0515  WBC 8.8 9.7  RBC 4.69 4.18*  HGB 14.9 13.4  HCT 46.3 40.0  MCV 98.7 95.7  MCH 31.8 32.1  MCHC 32.2 33.5  RDW 17.8* 17.7*  PLT 228  198    BNP Recent Labs  Lab 03/13/19 1930  BNP >4,500.0*     DDimer No results for input(s): DDIMER in the last 168 hours.   Radiology    Dg Chest 2 View  Result Date: 03/13/2019 CLINICAL DATA:  Chest pain. EXAM: CHEST - 2 VIEW COMPARISON:  02/20/2019 and 02/19/2019 FINDINGS: Persistent cardiomegaly. Pulmonary vascularity is normal. Small persistent right pleural effusion with lateral pleural thickening. Irregular area of residual scarring at the right lung base, slightly less prominent. Tiny residual left effusion. No discrete abnormalities of the left lung. No significant bone abnormality. IMPRESSION: Persistent small right pleural effusion with scarring at the right lung base. Tiny residual left effusion. Persistent cardiomegaly. No acute findings. Electronically Signed   By: Francene Boyers M.D.   On: 03/13/2019 13:29    Cardiac Studies   R/L Heart Cath 02/20/19  Prox LAD to Mid LAD lesion is 20% stenosed.  2nd Diag lesion is 70% stenosed.  Mid LAD-1 lesion is 20% stenosed.  Mid LAD-2 lesion is 80% stenosed.  A  drug-eluting stent was successfully placed using a STENT SYNERGY DES 2.5X38.  Post intervention, there is a 0% residual stenosis.  2nd Mrg lesion is 100% stenosed. 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  A drug-eluting stent was successfully placed using a STENT SYNERGY DES 2.25X24.  Post intervention, there is a 0% residual stenosis.  Moderate RCA disease. Severe distal branch vessel disease. Not amenable to PCI.  RPDA lesion is 70% stenosed.  2nd RPL lesion is 80% stenosed.  LV end diastolic pressure is normal.  There is no aortic valve stenosis.  Continue aggressive secondary prevention. Will need to continue CHF meds and repeat echo in 6-8 weeks to see if LV function has improved.    Echo 02/05/19 1. The left ventricle is normal in size with severely reduced systolic function, with an ejection fraction of 25-30%. There is moderately increased left ventricular wall thickness. Left ventricular diastolic Doppler parameters are consistent with  pseudonormalization. Left ventricular diffuse hypokinesis, appeared worse in the inferolateral wall. 2. The RV was normal in size with mildly decreased systolic function. 3. Left atrial size was moderately dilated. 4. No evidence of mitral valve stenosis. Trivial mitral regurgitation. 5. The aortic valve is tricuspid. Aortic valve regurgitation is mild by color flow Doppler. No stenosis of the aortic valve. 6. The inferior vena cava was dilated in size with >50% respiratory variability. PA systolic pressure 29 mmHg. 7. The aorta is abnormal in size and structure. 8. There is mild dilatation of the ascending aorta measuring 40 mm.  Patient Profile     53 y.o. male with a hx of 3vCAD s/p recent PCI to LAD and OM2, ICM (EF 25-30%), chronic tobacco use, HTN, DM, HLD, recent pneumonia (parapneumonic effusion, empyema s/p drainage)who is being seen today for the evaluation of troponin elevation in the  setting of acute heart failure.  Assessment & Plan    Acute on chronic systolic heart failure - Patient presented with worsening dyspnea and weight gain.   - Echo on 02/05/19 showed EF 25-30% - BNP >4,5000 - lasix increased to IV 80 mg BID - So far he is diuresing well and breathing is improving. - Weight - 8 lbs after re-weigh - Volume status -4 L - Creatinine so far stable >> AM labs pending  - Continue losartan 100 mg and toprol XL 100 mg daily - Still has minor crackles, JVD improved.  - Will  monitor O2/breathing while ambulating   Elevated troponin - Mild elevation 35 >104 >92  - Suspect demand ischemia in setting of decompensated heart failure   -Denies recurrent chest pain since PCI last month  CAD (severe 3V disease s/p recent PCI to LAD and OM2) -Medical managementof severe RCA disease.  - Denies chest pain - Reports compliance with his meds -ContinueASA, plavix, statin, metoprolol, imdur   HTN  - Continue home toprol XL, losartan, and imdur - Pressures still elevated. Patien said clonidine lowered his BP - Will try Clonidine 0.1 mg BID  HLD  - Continue atorvastatin 40 mg daily  Hypokalemia - 3.3 yesterday. Labs pending this AM - Will Supplement - Not on K+ at baseline  DM2 - per IM - Last A1C 8.7   For questions or updates, please contact [facility] Please consult [email or web address] for contact info under        Signed, Cadence Ninfa Meeker, PA-C  03/15/2019, 8:37 AM    Personally seen and examined. Agree with above.  53 year old with assessment of acute on chronic systolic heart failure, mildly elevated troponin consistent with demand ischemia, CAD with PCI to LAD and obtuse marginal 2 with no angina, hypertension, hyperlipidemia, hypokalemia and diabetes here with heart failure.  Still not at baseline, still with shortness of breath.  GEN: Thin in no acute distress  HEENT: normal  Neck: Mid neck JVD, carotid bruits, or masses  Cardiac: RRR; no murmurs, rubs, or gallops,no edema  Respiratory: Crackles GI: soft, nontender, nondistended, + BS MS: no deformity or atrophy  Skin: warm and dry, no rash Neuro:  Alert and Oriented x 3, Strength and sensation are intact Psych: euthymic mood, full affect  Echo-2020- EF 25 to 30%  Labs: Creatinine 1.07  Weight: 120 pounds on admission, 112 pounds currently  Plan:  -Continue with Lasix 80 mg IV twice daily, minor crackles still appreciated.  Ambulate.  Likely 1 more day of diuresis.  Candee Furbish, MD

## 2019-03-15 NOTE — Progress Notes (Addendum)
PROGRESS NOTE   Mark Pittman  MCN:[id number]    DOB: 08-24-1965    DOA: 03/13/2019  PCP: Marcine Matar, MD   I have briefly reviewed patients previous medical records in [facility].  Chief Complaint  Patient presents with  . Weakness  . Chest Pain    Brief Narrative:  53 year old male, lives with his parents, independent, PMH of CAD s/p recent PCI to LAD and OM 2, ICM (EF 25-30%), chronic tobacco use, HTN, DM, HLD, recent pneumonia with parapneumonic effusion, empyema s/p chest tube drainage, who presented with worsening dyspnea.  He was hospitalized 8/2-8/6 after transfer from [facility] due to dyspnea, chest pain and abnormal Lexiscan.  He had chest tube placed, completed pneumonia treatment, cardiac cath done and initially plan for medical treatment.  Readmitted 8/17 due to chest pain, taken back to Cath Lab and underwent DES to mid LAD and DES to OM1.  Now admitted for acute on chronic systolic CHF, cardiology consulting and being diuresed with IV Lasix.  Slowly improving.   Assessment & Plan:   Principal Problem:   Acute systolic CHF (congestive heart failure) (HCC) Active Problems:   Protein-calorie malnutrition, severe   Coronary artery disease   Ischemic cardiomyopathy   Essential hypertension   Type 2 diabetes mellitus with vascular disease (HCC)   Acute on chronic systolic CHF (congestive heart failure) (HCC)   Malnutrition of moderate degree   Acute on chronic systolic CHF/ICM  TTE 02/05/2019: LVEF 25-30%.  BMP >4500  Cardiology consulted and assisted with management.  Due to lack of adequate improvement, IV Lasix was increased on 9/9 to 80 mg twice daily.  Weight recording seems to be inaccurate.  He has lost anywhere between 8- 12 pounds since admission.  -5.4 L thus far.  Still has evidence of volume overload as suggested by mildly elevated JVD, dyspnea and crackles on lung exam.  Agree with cardiology regarding continuing 1 more day of IV  diuresis and reassess for stability for discharge in a.m.  Continue losartan 100 mg daily and Toprol-XL 100 mg daily.  CAD, s/p PCI to LAD and OM1/elevated troponin  Mildly elevated troponin, 35 > 104 > 92  Cardiology following.  Suspect demand ischemia due to decompensated CHF.  Had mild 3/10 precordial chest pain this morning which resolved after sublingual NTG.  EKG without acute changes.  Continue aspirin, Plavix, statins, metoprolol and Imdur.  Tobacco abuse  Cessation counseled.  Essential hypertension  Uncontrolled this morning on Toprol-XL, losartan and Imdur.  Cardiology of added clonidine 0.1 mg twice daily.  Monitor.  Could consider amlodipine.  Hyperlipidemia  Continue atorvastatin 40 mg daily.  Hypokalemia  Replaced.  Magnesium normal.  Type II DM with hyperglycemia  Better controlled today than yesterday.  Mildly uncontrolled and fluctuating.  Continue Lantus 10 units daily and SSI.  Adjust insulins as needed.  Oral meds held.  Last A1c 8.7.  Anxiety disorder  Reports taking Klonopin at home.  However as per prior hospitalist review of PDMP, has not filled since 2019.  Currently on Klonopin and hydroxyzine as needed for anxiety.  Will not prescribe on discharge and patient advised that he is to follow-up with his PCP regarding this.  He verbalized understanding.  Recent empyema  No acute issues at this time.  Chest x-ray shows persistent small right pleural effusion with scarring.  Lung nodules  Follow with pulmonologist as outpatient.  Nutritional Status Nutrition Problem: Moderate Malnutrition Etiology: chronic illness(CHF) Signs/Symptoms: mild fat depletion, moderate  fat depletion, mild muscle depletion, moderate muscle depletion Interventions: Ensure Enlive (each supplement provides 350kcal and 20 grams of protein), MVI, Magic cup   Social issues  Patient concerned regarding his difficulty in paying bills.  Requested RN to consult  social work/financial counselor.  DVT prophylaxis: Lovenox Code Status: Full Family Communication: I discussed in detail with patient's mother via phone, updated care and answered questions. Disposition: DC home pending clinical improvement, hopefully in the next 1 to 2 days   Consultants:  Cardiology  Procedures:  None  Antimicrobials:  None   Subjective: Night RN and patient reported that early this morning patient had 3/10 precordial chest pressure, nonradiating, resolved after sublingual NTG.  Dyspnea improved but still has some DOE and breathing is not yet at baseline.  No cough.  Reports anxiety and also indicates difficulty in paying bills.  Objective:  Vitals:   03/15/19 0904 03/15/19 0937 03/15/19 0937 03/15/19 1044  BP:  (!) 159/119 (!) 159/119 (!) 173/113  Pulse:   83 77  Resp:      Temp:      TempSrc:      SpO2:   98% 96%  Weight: 50.8 kg     Height:        Examination:  General exam: Pleasant young male, moderately built and thinly nourished, sitting up comfortably in bed without distress. Respiratory system: Few fine bibasilar crackles.  Rest of lung fields clear to auscultation without wheezing or rhonchi.  Mildly tachypneic after talking for some time. Cardiovascular system: S1 and S2 heard, RRR.  JVD +.  No ankle edema.  No murmurs.  Telemetry personally reviewed: Sinus rhythm with occasional PVCs. Gastrointestinal system: Abdomen is nondistended, soft and nontender. No organomegaly or masses felt. Normal bowel sounds heard. Central nervous system: Alert and oriented. No focal neurological deficits. Extremities: Symmetric 5 x 5 power. Skin: No rashes, lesions or ulcers Psychiatry: Judgement and insight appear normal. Mood & affect slightly anxious.     Data Reviewed: I have personally reviewed following labs and imaging studies  CBC: Recent Labs  Lab 03/13/19 1249 03/14/19 0515 03/15/19 0923  WBC 8.8 9.7 10.3  HGB 14.9 13.4 14.3  HCT 46.3  40.0 43.9  MCV 98.7 95.7 96.7  PLT 228 198 209   Basic Metabolic Panel: Recent Labs  Lab 03/13/19 1249 03/14/19 0515 03/15/19 0923  NA 139 142 141  K 4.4 3.3* 3.9  CL 103 105 103  CO2 24 26 28   GLUCOSE 348* 304* 223*  BUN 40* 34* 31*  CREATININE 1.13 0.91 1.07  CALCIUM 9.5 8.9 8.7*  MG  --  1.9  --    Liver Function Tests: No results for input(s): AST, ALT, ALKPHOS, BILITOT, PROT, ALBUMIN in the last 168 hours.  Cardiac Enzymes: No results for input(s): CKTOTAL, CKMB, CKMBINDEX, TROPONINI in the last 168 hours.  CBG: Recent Labs  Lab 03/14/19 0445 03/14/19 0620 03/14/19 1108 03/14/19 1634 03/15/19 0601  GLUCAP 313* 283* 311* 114* 171*    Recent Results (from the past 240 hour(s))  SARS Coronavirus 2 [facility](Hospital order, Performed in [facility] lab) Nasopharyngeal Nasopharyngeal Swab     Status: None   Collection Time: 03/13/19  5:19 PM   Specimen: Nasopharyngeal Swab  Result Value Ref Range Status   SARS Coronavirus 2 NEGATIVE NEGATIVE Final    Comment: (NOTE) If result is NEGATIVE SARS-CoV-2 target nucleic acids are NOT DETECTED. The SARS-CoV-2 RNA is generally detectable in upper and lower  respiratory specimens during  the acute phase of infection. The lowest  concentration of SARS-CoV-2 viral copies this assay can detect is 250  copies / mL. A negative result does not preclude SARS-CoV-2 infection  and should not be used as the sole basis for treatment or other  patient management decisions.  A negative result may occur with  improper specimen collection / handling, submission of specimen other  than nasopharyngeal swab, presence of viral mutation(s) within the  areas targeted by this assay, and inadequate number of viral copies  (<250 copies / mL). A negative result must be combined with clinical  observations, patient history, and epidemiological information. If result is POSITIVE SARS-CoV-2 target nucleic acids are DETECTED. The SARS-CoV-2 RNA is  generally detectable in upper and lower  respiratory specimens dur ing the acute phase of infection.  Positive  results are indicative of active infection with SARS-CoV-2.  Clinical  correlation with patient history and other diagnostic information is  necessary to determine patient infection status.  Positive results do  not rule out bacterial infection or co-infection with other viruses. If result is PRESUMPTIVE POSTIVE SARS-CoV-2 nucleic acids MAY BE PRESENT.   A presumptive positive result was obtained on the submitted specimen  and confirmed on repeat testing.  While 2019 novel coronavirus  (SARS-CoV-2) nucleic acids may be present in the submitted sample  additional confirmatory testing may be necessary for epidemiological  and / or clinical management purposes  to differentiate between  SARS-CoV-2 and other Sarbecovirus currently known to infect humans.  If clinically indicated additional testing with an alternate test  methodology [phone] is advised. The SARS-CoV-2 RNA is generally  detectable in upper and lower respiratory sp ecimens during the acute  phase of infection. The expected result is Negative. Fact Sheet for Patients:  BoilerBrush.com.cyhttps://www.fda.gov/media/136312/download Fact Sheet for Healthcare Providers: https://pope.com/https://www.fda.gov/media/136313/download This test is not yet approved or cleared by the [place] FDA and has been authorized for detection and/or diagnosis of SARS-CoV-2 by FDA under an Emergency Use Authorization (EUA).  This EUA will remain in effect (meaning this test can be used) for the duration of the COVID-19 declaration under Section 564(b)(1) of the Act, 21 U.S.C. section 360bbb-3(b)(1), unless the authorization is terminated or revoked sooner. Performed at [facility], [address] [place], [place] [address]          Radiology Studies: Dg Chest 2 View  Result Date: 03/13/2019 CLINICAL DATA:  Chest pain. EXAM: CHEST - 2 VIEW COMPARISON:   02/20/2019 and 02/19/2019 FINDINGS: Persistent cardiomegaly. Pulmonary vascularity is normal. Small persistent right pleural effusion with lateral pleural thickening. Irregular area of residual scarring at the right lung base, slightly less prominent. Tiny residual left effusion. No discrete abnormalities of the left lung. No significant bone abnormality. IMPRESSION: Persistent small right pleural effusion with scarring at the right lung base. Tiny residual left effusion. Persistent cardiomegaly. No acute findings. Electronically Signed   By: Francene BoyersJames  Maxwell M.D.   On: 03/13/2019 13:29        Scheduled Meds: . aspirin EC  81 mg Oral Daily  . atorvastatin  40 mg Oral q1800  . cloNIDine  0.1 mg Oral BID  . clopidogrel  75 mg Oral Daily  . [START ON 03/16/2019] enoxaparin (LOVENOX) injection  40 mg Subcutaneous Q24H  . feeding supplement (ENSURE ENLIVE)  237 mL Oral BID BM  . furosemide  80 mg Intravenous BID  . insulin aspart  0-9 Units Subcutaneous TID WC  . insulin glargine  10 Units  Subcutaneous Daily  . isosorbide mononitrate  30 mg Oral Daily  . losartan  100 mg Oral Daily  . metoprolol succinate  100 mg Oral Daily  . potassium chloride  40 mEq Oral BID   Continuous Infusions:   LOS: 1 day     Vernell Leep, MD, FACP, [facility]. [facility]  To contact the attending provider between 7A-7P or the covering provider during after hours 7P-7A, please log into the web site [email or web address] and access using universal [place] password for that web site. If you do not have the password, please call the hospital operator.  03/15/2019, 11:31 AM

## 2019-03-15 NOTE — Progress Notes (Signed)
Was told by student nurse that Pt's O2 sat dropped down to 87% on RA. Put on 2L [place], pt maintain 94-97%.

## 2019-03-16 ENCOUNTER — Telehealth: Payer: Self-pay | Admitting: Interventional Cardiology

## 2019-03-16 ENCOUNTER — Other Ambulatory Visit: Payer: Self-pay

## 2019-03-16 LAB — BASIC METABOLIC PANEL
Anion gap: 10 (ref 5–15)
BUN: 32 mg/dL — ABNORMAL HIGH (ref 6–20)
CO2: 30 mmol/L (ref 22–32)
Calcium: 9 mg/dL (ref 8.9–10.3)
Chloride: 104 mmol/L (ref 98–111)
Creatinine, Ser: 0.86 mg/dL (ref 0.61–1.24)
GFR calc Af Amer: >60 mL/min (ref >60)
GFR calc non Af Amer: >60 mL/min (ref >60)
Glucose, Bld: 143 mg/dL — ABNORMAL HIGH (ref 70–99)
Potassium: 3.8 mmol/L (ref 3.5–5.1)
Sodium: 144 mmol/L (ref 135–145)

## 2019-03-16 LAB — GLUCOSE, CAPILLARY
Glucose-Capillary: 145 mg/dL — ABNORMAL HIGH (ref 70–99)
Glucose-Capillary: 354 mg/dL — ABNORMAL HIGH (ref 70–99)

## 2019-03-16 MED ORDER — CLONIDINE HCL 0.1 MG PO TABS: 0.1000 mg | ORAL_TABLET | Freq: Two times a day (BID) | ORAL | 0 refills | Status: DC

## 2019-03-16 MED ORDER — FUROSEMIDE 40 MG PO TABS: 40.0000 mg | ORAL_TABLET | Freq: Every day | ORAL | 0 refills | Status: DC

## 2019-03-16 MED ORDER — ENSURE ENLIVE PO LIQD: 237.0000 mL | Freq: Two times a day (BID) | ORAL | 12 refills | Status: AC

## 2019-03-16 MED ORDER — FUROSEMIDE 40 MG PO TABS: 40.0000 mg | ORAL_TABLET | Freq: Every day | ORAL | 0 refills | Status: AC

## 2019-03-16 MED FILL — cloNIDine HCL 0.1 MG TABS: 0.1 | 30 days supply | Qty: 60 | Fill #0

## 2019-03-16 MED FILL — FUROSEMIDE 40 MG TABLET: 40 | 30 days supply | Qty: 30 | Fill #0

## 2019-03-16 NOTE — Progress Notes (Signed)
SATURATION QUALIFICATIONS: (This note is used to comply with regulatory documentation for home oxygen)  Patient Saturations on Room Air at Rest = 97%  Patient Saturations on Room Air while Ambulating = 94-96%

## 2019-03-16 NOTE — Progress Notes (Signed)
Inpatient Diabetes Program Recommendations  AACE/ADA: New Consensus Statement on Inpatient Glycemic Control (2015)  Target Ranges:  Prepandial:   less than 140 mg/dL      Peak postprandial:   less than 180 mg/dL (1-2 hours)      Critically ill patients:  140 - 180 mg/dL   Lab Results  Component Value Date   GLUCAP 145 (H) 03/16/2019   HGBA1C 8.7 (H) 02/04/2019    Review of Glycemic Control Results for SHAWNN, BOUILLON (MRN [id number]) as of 03/16/2019 08:27  Ref. Range 03/15/2019 06:01 03/15/2019 11:42 03/15/2019 16:40 03/15/2019 21:15 03/16/2019 06:19  Glucose-Capillary Latest Ref Range: 70 - 99 mg/dL 171 (H) 280 (H) 247 (H) 359 (H) 145 (H)   Diabetes history: DM 2 Outpatient Diabetes medications: Glipizide 5 mg bid, Metformin 500 mg bid Current orders for Inpatient glycemic control:  Lantus 10 units Daily Novolog 0-9 units tid Novolog 0-5 units qhs  Inpatient Diabetes Program Recommendations:    Glucose trends increase after meals. Consider Novolog 4 units tid meal coverage if patient consumes at least 50% of meals.  Thanks,  Tama Headings RN, MSN, BC-ADM Inpatient Diabetes Coordinator Team Pager [phone] (8a-5p)

## 2019-03-16 NOTE — Discharge Instructions (Addendum)

## 2019-03-16 NOTE — Plan of Care (Signed)
  Problem: Education: Goal: Knowledge of General Education information will improve Description Including pain rating scale, medication(s)/side effects and non-pharmacologic comfort measures Outcome: Progressing   

## 2019-03-16 NOTE — Discharge Summary (Addendum)
Physician Discharge Summary  Mark Pittman FFM:[id number] DOB: 1965/08/04  PCP: [name], MD  Admitted from: Home Discharged to: Home  Admit date: 03/13/2019 Discharge date: 03/16/2019  Recommendations for Outpatient Follow-up:   Follow-up Information    [name], MD. Go on 03/22/2019.   Specialty: Internal Medicine Why: '@2'$ :30pm. To be seen with repeat labs (CBC & BMP). Follow up pulmonary nodules as out patient as deemed necessary. Contact information: [place] [address] [phone]        [name], PA Follow up on 04/03/2019.   Specialty: Cardiology Why: Appointment time 2:15 PM Contact information: [address] [place] [address] [phone]            Home Health: RN Equipment/Devices: None  Discharge Condition: Improved and stable CODE STATUS: Full Diet recommendation: Heart healthy & diabetic diet.  Discharge Diagnoses:  Principal Problem:   Acute systolic CHF (congestive heart failure) (HCC) Active Problems:   Protein-calorie malnutrition, severe   Coronary artery disease   Ischemic cardiomyopathy   Essential hypertension   Type 2 diabetes mellitus with vascular disease (HCC)   Acute on chronic systolic CHF (congestive heart failure) (HCC)   Malnutrition of moderate degree   Brief Summary: 53 year old male, lives with his parents, independent, PMH of CAD s/p recent PCI to LAD and OM 2, ICM (EF 25-30%), chronic tobacco use, HTN, DM, HLD, recent pneumonia with parapneumonic effusion, empyema s/p chest tube drainage, who presented with worsening dyspnea.  He was hospitalized 8/2-8/6 after transfer from [facility] due to dyspnea, chest pain and abnormal Lexiscan. He had chest tube placed, completed pneumonia treatment, cardiac cath done and initially plan for medical treatment.  Readmitted 8/17 due to chest pain, taken back to Cath Lab and underwent DES to mid LAD and DES to OM1.  Now  admitted for acute on chronic systolic CHF & Cardiology was consulted who assisted with care.  Assessment & Plan:  Acute on chronic systolic CHF/ICM  TTE 12/05/2631: LVEF 25-30%.  BMP >4500  Cardiology consulted and assisted with management.  Due to lack of adequate improvement, IV Lasix was increased on 9/9 to 80 mg twice daily.  Weight down by ~ 15 lbs since admission.  - 8.1 L thus far.  Continue losartan 100 mg daily and Toprol-XL 100 mg daily.  Clinically improved and appears euvolemic.  Cardiology follow-up appreciated and have cleared for discharge home on increased dose of Lasix 40 mg daily and an additional 40 mg for weight greater than 3 pounds.  Cardiology have arranged close outpatient follow-up.  Not hypoxic even with activity on room air.  CAD, s/p PCI to LAD and OM1/elevated troponin  Mildly elevated troponin, 35 > 104 > 92  Cardiology seen.  Suspect demand ischemia due to decompensated CHF.  Had mild 3/10 precordial chest pain this morning which resolved after sublingual NTG.  EKG without acute changes. Has intermittent atypical chest pain, this am mild chest discomfort appeared MSS when having mild dry cough. Resolved.  Continue aspirin, Plavix, statins, metoprolol and Imdur.  Tobacco abuse  Cessation counseled. States was smoking 3 cigarettes/ day.  Essential hypertension  Was uncontrolled on Toprol-XL, losartan and Imdur.  Cardiology added clonidine 0.1 mg twice daily. Better but still not optimal especially premedications in the morning. Some of this may be driven by patient anxiety. Asymptomatic of same. Continue current regimen at DC and close OP follow up re further adjustment of anti hypertensive's.  Could consider  amlodipine instead of Clonidine if doesn't work.  Hyperlipidemia  Continue atorvastatin 40 mg daily.  Hypokalemia  Replaced.  Magnesium normal.  Type II DM with hyperglycemia  uncontrolled and fluctuating.  Treated in  the hospital with Lantus 10 units daily and SSI. Oral meds held and resumed at DC.  Last A1c 8.7. Poor OP control. As per my extensive d/w patient's mother yesterday, patient is non compliant with his meds and diet. Counciled patient and Mom re compliance.  Anxiety disorder  Reports taking Klonopin at home.  However as per prior hospitalist review of PDMP, has not filled since 2019.  Here on Klonopin and hydroxyzine as needed for anxiety.  Will not prescribe on discharge and patient advised that he is to follow-up with his PCP regarding this.  He verbalized understanding.  Recent empyema  No acute issues at this time.  Chest x-ray shows persistent small right pleural effusion with scarring.  Lung nodules  Follow with pulmonologist as outpatient.  Nutritional Status Nutrition Problem: Moderate Malnutrition Etiology: chronic illness(CHF) Signs/Symptoms: mild fat depletion, moderate fat depletion, mild muscle depletion, moderate muscle depletion Interventions: Ensure Enlive (each supplement provides 350kcal and 20 grams of protein), MVI, Magic cup   Social issues  Patient concerned regarding his difficulty in paying bills.  Requested RN to consult social work/financial counselor who reportedly met with patient.   Consultants:  Cardiology  Procedures:  None   Discharge Instructions  Discharge Instructions    (HEART FAILURE PATIENTS) Call MD:  Anytime you have any of the following symptoms: 1) 3 pound weight gain in 24 hours or 5 pounds in 1 week 2) shortness of breath, with or without a dry hacking cough 3) swelling in the hands, feet or stomach 4) if you have to sleep on extra pillows at night in order to breathe.   Complete by: As directed    Call MD for:  difficulty breathing, headache or visual disturbances   Complete by: As directed    Call MD for:  extreme fatigue   Complete by: As directed    Call MD for:  persistant dizziness or light-headedness   Complete  by: As directed    Call MD for:  severe uncontrolled pain   Complete by: As directed    Diet - low sodium heart healthy   Complete by: As directed    Diet Carb Modified   Complete by: As directed    Increase activity slowly   Complete by: As directed        Medication List    TAKE these medications   aspirin 81 MG chewable tablet Chew 1 tablet (81 mg total) by mouth daily.   atorvastatin 40 MG tablet Commonly known as: LIPITOR Take 1 tablet (40 mg total) by mouth daily at 6 PM.   cloNIDine 0.1 MG tablet Commonly known as: CATAPRES Take 1 tablet (0.1 mg total) by mouth 2 (two) times daily.   clopidogrel 75 MG tablet Commonly known as: PLAVIX Take 1 tablet (75 mg total) by mouth daily.   feeding supplement (ENSURE ENLIVE) Liqd Take 237 mLs by mouth 2 (two) times daily between meals.   furosemide 40 MG tablet Commonly known as: LASIX Take 1 tablet (40 mg total) by mouth daily. Take an extra Lasix tablet (40 mg total) if weight starts to increase greater than 3 pounds. What changed:   medication strength  how much to take  additional instructions   glipiZIDE 5 MG tablet Commonly known as: GLUCOTROL Take 1  tablet (5 mg total) by mouth 2 (two) times daily.   isosorbide mononitrate 30 MG 24 hr tablet Commonly known as: IMDUR Take 1 tablet (30 mg total) by mouth daily.   losartan 100 MG tablet Commonly known as: COZAAR Take 1 tablet (100 mg total) by mouth daily.   metFORMIN 500 MG tablet Commonly known as: GLUCOPHAGE Take 1 tablet (500 mg total) by mouth 2 (two) times daily with a meal.   metoprolol succinate 100 MG 24 hr tablet Commonly known as: TOPROL-XL Take 1 tablet (100 mg total) by mouth daily. Take with or immediately following a meal.   nitroGLYCERIN 0.4 MG SL tablet Commonly known as: NITROSTAT Place 0.4 mg under the tongue every 5 (five) minutes as needed for chest pain.      No Known Allergies    Procedures/Studies: Dg Chest 2  View  Result Date: 03/13/2019 CLINICAL DATA:  Chest pain. EXAM: CHEST - 2 VIEW COMPARISON:  02/20/2019 and 02/19/2019 FINDINGS: Persistent cardiomegaly. Pulmonary vascularity is normal. Small persistent right pleural effusion with lateral pleural thickening. Irregular area of residual scarring at the right lung base, slightly less prominent. Tiny residual left effusion. No discrete abnormalities of the left lung. No significant bone abnormality. IMPRESSION: Persistent small right pleural effusion with scarring at the right lung base. Tiny residual left effusion. Persistent cardiomegaly. No acute findings. Electronically Signed   By: Lorriane Shire M.D.   On: 03/13/2019 13:29    Subjective: Feels much better.  Denies dyspnea even with activity.  Had mild mid central chest discomfort upon coughing which resolved right afterwards.  Denies any other complaints.  No dizziness, lightheadedness or palpitations.  As per RN, no acute issues noted.  Discharge Exam:  Vitals:   03/16/19 0453 03/16/19 0605 03/16/19 0838 03/16/19 0922  BP: (!) 163/105 (!) 142/104 (!) 148/114 129/82  Pulse: 77 68 81 75  Resp: 17     Temp: 97.6 F (36.4 C)     TempSrc: Oral     SpO2: 94% 94% 91% 97%  Weight: 47.5 kg     Height:        General exam: Pleasant young male, moderately built and thinly nourished, sitting up comfortably in bed without distress. Respiratory system:  Clear to auscultation.  No increased work of breathing. Cardiovascular system: S1 and S2 heard, RRR.  No JVD.  No ankle edema.  No murmurs.  Telemetry personally reviewed: Sinus rhythm with occasional PVCs. Gastrointestinal system: Abdomen is nondistended, soft and nontender. No organomegaly or masses felt. Normal bowel sounds heard. Central nervous system: Alert and oriented. No focal neurological deficits. Extremities: Symmetric 5 x 5 power. Skin: No rashes, lesions or ulcers Psychiatry: Judgement and insight appear normal. Mood & affect slightly  anxious.    The results of significant diagnostics from this hospitalization (including imaging, microbiology, ancillary and laboratory) are listed below for reference.     Microbiology: Recent Results (from the past 240 hour(s))  SARS Coronavirus 2 [facility] order, Performed in [facility] hospital lab) Nasopharyngeal Nasopharyngeal Swab     Status: None   Collection Time: 03/13/19  5:19 PM   Specimen: Nasopharyngeal Swab  Result Value Ref Range Status   SARS Coronavirus 2 NEGATIVE NEGATIVE Final    Comment: (NOTE) If result is NEGATIVE SARS-CoV-2 target nucleic acids are NOT DETECTED. The SARS-CoV-2 RNA is generally detectable in upper and lower  respiratory specimens during the acute phase of infection. The lowest  concentration of SARS-CoV-2 viral copies this assay can detect  is 250  copies / mL. A negative result does not preclude SARS-CoV-2 infection  and should not be used as the sole basis for treatment or other  patient management decisions.  A negative result may occur with  improper specimen collection / handling, submission of specimen other  than nasopharyngeal swab, presence of viral mutation(s) within the  areas targeted by this assay, and inadequate number of viral copies  (<250 copies / mL). A negative result must be combined with clinical  observations, patient history, and epidemiological information. If result is POSITIVE SARS-CoV-2 target nucleic acids are DETECTED. The SARS-CoV-2 RNA is generally detectable in upper and lower  respiratory specimens dur ing the acute phase of infection.  Positive  results are indicative of active infection with SARS-CoV-2.  Clinical  correlation with patient history and other diagnostic information is  necessary to determine patient infection status.  Positive results do  not rule out bacterial infection or co-infection with other viruses. If result is PRESUMPTIVE POSTIVE SARS-CoV-2 nucleic acids MAY BE PRESENT.   A  presumptive positive result was obtained on the submitted specimen  and confirmed on repeat testing.  While 2019 novel coronavirus  (SARS-CoV-2) nucleic acids may be present in the submitted sample  additional confirmatory testing may be necessary for epidemiological  and / or clinical management purposes  to differentiate between  SARS-CoV-2 and other Sarbecovirus currently known to infect humans.  If clinically indicated additional testing with an alternate test  methodology [phone]) is advised. The SARS-CoV-2 RNA is generally  detectable in upper and lower respiratory sp ecimens during the acute  phase of infection. The expected result is Negative. Fact Sheet for Patients:  StrictlyIdeas.no Fact Sheet for Healthcare Providers: BankingDealers.co.za This test is not yet approved or cleared by the [place] FDA and has been authorized for detection and/or diagnosis of SARS-CoV-2 by FDA under an Emergency Use Authorization (EUA).  This EUA will remain in effect (meaning this test can be used) for the duration of the COVID-19 declaration under Section 564(b)(1) of the Act, 21 U.S.C. section 360bbb-3(b)(1), unless the authorization is terminated or revoked sooner. Performed at [facility], [place] [address], [place], [place] [address]      Labs: CBC: Recent Labs  Lab 03/13/19 1249 03/14/19 0515 03/15/19 0923  WBC 8.8 9.7 10.3  HGB 14.9 13.4 14.3  HCT 46.3 40.0 43.9  MCV 98.7 95.7 96.7  PLT 228 198 735   Basic Metabolic Panel: Recent Labs  Lab 03/13/19 1249 03/14/19 0515 03/15/19 0923 03/16/19 0425  NA 139 142 141 144  K 4.4 3.3* 3.9 3.8  CL 103 105 103 104  CO2 24 26 28 30   GLUCOSE 348* 304* 223* 143*  BUN 40* 34* 31* 32*  CREATININE 1.13 0.91 1.07 0.86  CALCIUM 9.5 8.9 8.7* 9.0  MG  --  1.9  --   --    Liver Function Tests: No results for input(s): AST, ALT, ALKPHOS, BILITOT, PROT, ALBUMIN in the last 168  hours. BNP (last 3 results) Recent Labs    02/04/19 2119 02/19/19 1617 03/13/19 1930  BNP 1,550.4* >4,500.0* >4,500.0*   CBG: Recent Labs  Lab 03/15/19 1142 03/15/19 1640 03/15/19 2115 03/16/19 0619 03/16/19 1123  GLUCAP 280* 247* 359* 145* 354*   Thyroid function studies Recent Labs    03/14/19 0515  TSH 3.528     Time coordinating discharge: 35 minutes  SIGNED:  Vernell Leep, MD, FACP, [facility]. [facility]  To contact the attending  provider between 7A-7P or the covering provider during after hours 7P-7A, please log into the web site [email or web address] and access using universal [place] password for that web site. If you do not have the password, please call the hospital operator.

## 2019-03-16 NOTE — Telephone Encounter (Signed)
New message     Patient is in the hospital at [facility].  Patient's mother want to know if he is going to be discharged today.  Not sure who is discharging patient.

## 2019-03-16 NOTE — Telephone Encounter (Signed)
I spoke to the patient's mother who called about her son's discharge status from the hospital.  I told her to reach out to the hospital for that information.  She verbalized understanding.

## 2019-03-16 NOTE — Progress Notes (Addendum)
Progress Note  Patient Name: Mark ChessmanJeffrey Pittman Date of Encounter: 03/16/2019  Primary Cardiologist: Lance MussJayadeep Varanasi, MD   Subjective   Yesterday while walking O2 dropped to 87%. Will recheck this AM. Patient feels breathing is much better.   Inpatient Medications    Scheduled Meds: . aspirin EC  81 mg Oral Daily  . atorvastatin  40 mg Oral q1800  . cloNIDine  0.1 mg Oral BID  . clopidogrel  75 mg Oral Daily  . enoxaparin (LOVENOX) injection  40 mg Subcutaneous Q24H  . feeding supplement (ENSURE ENLIVE)  237 mL Oral BID BM  . furosemide  80 mg Intravenous BID  . insulin aspart  0-5 Units Subcutaneous QHS  . insulin aspart  0-9 Units Subcutaneous TID WC  . insulin glargine  10 Units Subcutaneous Daily  . isosorbide mononitrate  30 mg Oral Daily  . losartan  100 mg Oral Daily  . metoprolol succinate  100 mg Oral Daily  . potassium chloride  40 mEq Oral BID   Continuous Infusions:  PRN Meds: acetaminophen **OR** acetaminophen, clonazePAM, hydrOXYzine, nitroGLYCERIN, ondansetron **OR** ondansetron (ZOFRAN) IV   Vital Signs    Vitals:   03/15/19 1939 03/16/19 0453 03/16/19 0605 03/16/19 0838  BP: (!) 140/98 (!) 163/105 (!) 142/104 (!) 148/114  Pulse: 83 77 68 81  Resp: 16 17    Temp: (!) 97.4 F (36.3 C) 97.6 F (36.4 C)    TempSrc: Oral Oral    SpO2: 90% 94% 94% 91%  Weight:  47.5 kg    Height:        Intake/Output Summary (Last 24 hours) at 03/16/2019 0920 Last data filed at 03/16/2019 0800 Gross per 24 hour  Intake 942 ml  Output 4425 ml  Net -3483 ml   Last 3 Weights 03/16/2019 03/15/2019 03/15/2019  Weight (lbs) 104 lb 11.2 oz 112 lb 1.6 oz 108 lb 11.2 oz  Weight (kg) 47.492 kg 50.848 kg 49.306 kg      Telemetry    NSR, rate 70-80s with frequent PVCs - Personally Reviewed  ECG    No new - Personally Reviewed  Physical Exam   GEN: No acute distress.   Neck: No JVD Cardiac: RRR, no murmurs, rubs, or gallops.  Respiratory: Clear to auscultation  bilaterally. GI: Soft, nontender, non-distended  MS: No edema; No deformity. Neuro:  Nonfocal  Psych: Normal affect   Labs    High Sensitivity Troponin:   Recent Labs  Lab 02/19/19 1617 02/19/19 1800 03/13/19 1249 03/13/19 1729  TROPONINIHS 35* 35* 104* 92*      Chemistry Recent Labs  Lab 03/14/19 0515 03/15/19 0923 03/16/19 0425  NA 142 141 144  K 3.3* 3.9 3.8  CL 105 103 104  CO2 26 28 30   GLUCOSE 304* 223* 143*  BUN 34* 31* 32*  CREATININE 0.91 1.07 0.86  CALCIUM 8.9 8.7* 9.0  GFRNONAA >60 >60 >60  GFRAA >60 >60 >60  ANIONGAP 11 10 10      Hematology Recent Labs  Lab 03/13/19 1249 03/14/19 0515 03/15/19 0923  WBC 8.8 9.7 10.3  RBC 4.69 4.18* 4.54  HGB 14.9 13.4 14.3  HCT 46.3 40.0 43.9  MCV 98.7 95.7 96.7  MCH 31.8 32.1 31.5  MCHC 32.2 33.5 32.6  RDW 17.8* 17.7* 18.0*  PLT 228 198 209    BNP Recent Labs  Lab 03/13/19 1930  BNP >4,500.0*     DDimer No results for input(s): DDIMER in the last 168 hours.   Radiology  No results found.  Cardiac Studies   R/L Heart Cath 02/20/19  Prox LAD to Mid LAD lesion is 20% stenosed.  2nd Diag lesion is 70% stenosed.  Mid LAD-1 lesion is 20% stenosed.  Mid LAD-2 lesion is 80% stenosed.  A drug-eluting stent was successfully placed using a STENT SYNERGY DES 2.5X38.  Post intervention, there is a 0% residual stenosis.  2nd Mrg lesion is 100% stenosed. 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  A drug-eluting stent was successfully placed using a STENT SYNERGY DES 2.25X24.  Post intervention, there is a 0% residual stenosis.  Moderate RCA disease. Severe distal branch vessel disease. Not amenable to PCI.  RPDA lesion is 70% stenosed.  2nd RPL lesion is 80% stenosed.  LV end diastolic pressure is normal.  There is no aortic valve stenosis.  Continue aggressive secondary prevention. Will need to continue CHF meds and repeat echo in 6-8 weeks to see if LV  function has improved.   Echo 02/05/19 1. The left ventricle is normal in size with severely reduced systolic function, with an ejection fraction of 25-30%. There is moderately increased left ventricular wall thickness. Left ventricular diastolic Doppler parameters are consistent with  pseudonormalization. Left ventricular diffuse hypokinesis, appeared worse in the inferolateral wall. 2. The RV was normal in size with mildly decreased systolic function. 3. Left atrial size was moderately dilated. 4. No evidence of mitral valve stenosis. Trivial mitral regurgitation. 5. The aortic valve is tricuspid. Aortic valve regurgitation is mild by color flow Doppler. No stenosis of the aortic valve. 6. The inferior vena cava was dilated in size with >50% respiratory variability. PA systolic pressure 29 mmHg. 7. The aorta is abnormal in size and structure. 8. There is mild dilatation of the ascending aorta measuring 40 mm.  Patient Profile     53 y.o. male with a hx of 3vCAD s/p recent PCI to LAD and OM2, ICM (EF 25-30%), chronic tobacco use, HTN, DM, HLD, recent pneumonia (parapneumonic effusion, empyema s/p drainage)who is being seen today for the evaluation of troponin elevationin the setting of acute heart failure.  Assessment & Plan    Acute on chronic systolic heart failure - Patient presented with worsening dyspnea and weight gain. - Echo on 02/05/19 showedEF 25-30% - BNP>4,5000 - Patient has been diuresing well on IV 80 mg BID - Volume status -7 L - Breathing improved - Creatinine so far stable, 0.86 this AM - Continue losartan 100 mg and toprol XL 100 mg daily - Lungs sound clear and  No JVD. - Yesterday while walking oxygen levels dropped to 87% and was put on 2L O2 during ambulation. Will recheck this AM. If O2 remains stable can likely discharge patient.  Elevated troponin - Mild elevation35 >104 >92 -Suspect demand ischemia in setting of decompensated heart  failure -Denies recurrentchest pain since PCI last month  CAD(severe 3V disease s/p recent PCI to LAD and OM2) -Medical managementof severe RCA disease. -Denies chest pain -Reports compliance with his meds -ContinueASA, plavix, statin, metoprolol, imdur   HTN - Continue home toprol XL, losartan, and imdur -Pressures still slightly elevated on Clonidine 0.1 mg BID - Monitor  HLD - Continue atorvastatin 40 mg daily  Hypokalemia - 3.8 this AM - Receiving 40 mEq BID  DM2 - per IM - Last A1C 8.7   For questions or updates, please contact [facility] Please consult [email or web address] for contact info under        Signed, Cadence H  Fransico Michael, PA-C  03/16/2019, 9:20 AM    Personally seen and examined. Agree with above.   Overall doing much better.  Weight is down to approximately 105 pounds down from 120 pounds.  Abdomen normal now.  He is ready to go home.  No crackles.  Thin, cachectic.  Acute on chronic systolic heart failure - Instead of 20 mg a day at home of Lasix, lets give him 40 mg a day at home.  I have instructed him to take an extra Lasix if his weight starts to increase greater than 3 pounds.  He knows to stay away from salt and added fluids. -EF 25 to 30%   Coronary artery disease with diabetes hypertension hyperlipidemia - Continue with current medications.  No changes made.  No anginal symptoms.  Ready for discharge from cardiology perspective.  We will have follow-up with him with Dr. Eldridge Dace or APP.  Donato Schultz, MD

## 2019-03-16 NOTE — Telephone Encounter (Addendum)
Call placed to Tomi Bamberger, RN CM informed her that the patient's mother called [facility]  requesting home health services for patient. His mother stated that she was informed by ED to contact PCP to order home skilled nursing.   Informed Neoma Laming that the orders need to be placed by the hospital because the patient does not have insurance and we will not be able to refer him from the community. He needs charity services. She was informing the discharging provider.

## 2019-03-19 ENCOUNTER — Telehealth: Payer: Self-pay

## 2019-03-19 MED FILL — CLOPIDOGREL 75 MG TABLET: 75 | 90 days supply | Qty: 90 | Fill #0

## 2019-03-19 MED FILL — ATORVASTATIN CALCIUM 40 MG: 40 | 30 days supply | Qty: 30 | Fill #0

## 2019-03-19 MED FILL — METOPROLOL SUCCINATE ER 100: 100 | 30 days supply | Qty: 30 | Fill #0

## 2019-03-19 MED FILL — LOSARTAN POTASSIUM 100 MG T: 100 | 30 days supply | Qty: 30 | Fill #0

## 2019-03-19 MED FILL — ISOSORBIDE MN ER 30 MG TAB: 30 | 30 days supply | Qty: 30 | Fill #0

## 2019-03-19 NOTE — Telephone Encounter (Signed)
Transition Care Management Follow-up Telephone Call Date of discharge and from where: 03/16/2019, [facility]   Call placed to # [phone], message left requesting a call back to this CM # [phone]

## 2019-03-20 ENCOUNTER — Telehealth: Payer: Self-pay

## 2019-03-20 NOTE — Telephone Encounter (Signed)
From the discharge call  Patient has appointment with walk in provider 03/22/2019.    He said he did not need to review the medication list as it was reviewed thoroughly before he left the hospital. He said that he has all medications except clonidine and nitrogylcerin. He explained that he will not take the clonidine because it makes his mouth too dry. He has taken it for years but will not take it again. He said that he is taking all other medications as ordered.  He said the he does not have any nitroglycerin and there is no refill at the pharmacy. He stated that he usually takes 1 tablet every night for chest pain.  He described the pain as an " uncomfortable" feeling in the middle of his chest that is relieved with nitro. The pain does not radiate, no nausea, shortness of breath. no chest pain reported  at the time of the call noting that it occurs when he goes to bed. Informed him that the provider would be notified of this concern.    Has has a scale. Instructed about daily weights, keeping a log.  He said that he has been underweight and plans to gain weight.  Has been drinking Ensure.  Has glucometer, checks random blood sugars every other day.  Says his blood sugars have been in the 400's. This afternoon it was 484. He said that he was has been on insulin in the past but is now only taking oral medication.

## 2019-03-20 NOTE — Telephone Encounter (Signed)
Transition Care Management Follow-up Telephone Call  Date of discharge and from where: 03/16/2019, [facility]   How have you been since you were released from the hospital? He stated he was doing okay.   Any questions or concerns? Concerned about costs of medications. Explained to him that [facility] pharmacy has programs that may be able to provide assistance with medication costs. Blue card, [facility], samples.   Has applied for medicaid and was denied and has re-applied.  Has applied for disability, also denied and has appealed.   Lives in [place], not eligible for [employer].   Items Reviewed:  Did the pt receive and understand the discharge instructions provided? Yes he has them and said he has no questions.   Medications obtained and verified? He said he did not need to review the medication list as it was reviewed thoroughly before he left the hospital. He said that he has all medications except clonidine and nitrogylcerin. He explained that he will not take the clonidine because it makes his mouth too dry. He has taken it for years but will not take it again. He said that he is taking all other medications as ordered.  He said the he does not have any nitroglycerin and there is no refill at the pharmacy. He stated that he usually takes 1 tablet every night for chest pain.  He described the pain as an " uncomfortable" feeling in the middle of his chest that is relieved with nitro. The pain does not radiate, no nausea, shortness of breath. no chest pain reported  at the time of the call noting that it occurs when he goes to bed. Informed him that the provider would be notified of this concern.   Any new allergies since your discharge?  none reported   Do you have support at home? Yes, lives with his parents  Other (ie: DME, Home Health, etc) home health ordered though [facility]. They called today and patient/father need to call back to schedule home  visit.  Has has a scale. Instructed about daily weights, keeping a log.  He said that he has been underweight and plans to gain weight.  Has been drinking Ensure.  Has glucometer, checks random blood sugars every other day.  Says his blood sugars have been in the 400's. This afternoon it was 484. He said that he was has been on insulin in the past but is now only taking oral medication.     Functional Questionnaire: (I = Independent and D = Dependent) ADL's: independent -parents support as needed   Follow up appointments reviewed:    PCP Hospital f/u appt confirmed? 03/22/2019 with walk in provider.   [facility] f/u appt confirmed? Cardiology - 04/03/2019  Are transportation arrangements needed?no, he has transportation.   If their condition worsens, is the pt aware to call  their PCP or go to the ED? yes  Was the patient provided with contact information for the PCP's office or ED? He was given the phone number for the clinic  Was the pt encouraged to call back with questions or concerns?yes

## 2019-03-21 NOTE — Progress Notes (Signed)
Patient ID: Mark Pittman, male   DOB: 06-27-1966, 53 y.o.   MRN: [id number]     Mark Pittman, is a 53 y.o. male  CVE:[id number]  WCH:[id number]  DOB - 1965-07-19  Subjective:  Chief Complaint and HPI: Mark Pittman is a 53 y.o. male here today for a follow up visit After hospitalization 9/8-9/05/2019.  He is gaining weight again but has been eating a lot more and drinking nutritional supplements.  He does have SOB but does not feel it has been worsening.  Blood sugars running in 300-400s.  Previously on insulin.  Not taking clonidine bc it causes dry mouth so he hasn't taken any today.  He only took all of his other meds just before his appt this afternoon.  There has a been a h/o poor compliance reported in the chart.  He c/o severe anxiety and was previously prescribed Clonazepam and took it regularly in the past.  He says he was given this in the hospital about 1 month ago.  Needs Rx for nitroglycerin.  He has occasional CP that is relieved with nitroglycerin.    From discharge summary: Discharge Diagnoses:  Principal Problem:   Acute systolic CHF (congestive heart failure) (HCC) Active Problems:   Protein-calorie malnutrition, severe   Coronary artery disease   Ischemic cardiomyopathy   Essential hypertension   Type 2 diabetes mellitus with vascular disease (HCC)   Acute on chronic systolic CHF (congestive heart failure) (HCC)   Malnutrition of moderate degree   Brief Summary: 53 year old male, lives with his parents, independent, PMH of CAD s/p recent PCI to LAD and OM 2, ICM (EF 25-30%), chronic tobacco use, HTN, DM, HLD, recent pneumonia with parapneumonic effusion, empyema s/p chest tube drainage, who presented with worsening dyspnea. He was hospitalized 8/2-8/6 after transfer from [facility] due to dyspnea, chest painand abnormal Lexiscan. He had chest tube placed, completed pneumonia treatment, cardiac cath done and initially plan for medical treatment.  Readmitted 8/17 due to chest pain, taken back to Cath Lab and underwent DES to mid LAD and DES to OM1. Now admitted for acute on chronic systolic CHF & Cardiology was consulted who assisted with care.  Assessment & Plan:  Acute on chronic systolic CHF/ICM  TTE 09/06/3612: LVEF 25-30%.  BMP >4500  Cardiology consulted and assisted with management.  Due to lack of adequate improvement, IV Lasix was increased on 9/9 to 80 mg twice daily.  Weight down by ~ 15 lbs since admission. - 8.1 L thus far.  Continue losartan 100 mg daily and Toprol-XL 100 mg daily.  Clinically improved and appears euvolemic.  Cardiology follow-up appreciated and have cleared for discharge home on increased dose of Lasix 40 mg daily and an additional 40 mg for weight greater than 3 pounds.  Cardiology have arranged close outpatient follow-up.  Not hypoxic even with activity on room air.  CAD, s/p PCI to LAD and OM1/elevated troponin  Mildly elevated troponin, 35 > 104 > 92  Cardiology seen. Suspect demand ischemia due to decompensated CHF.  Had mild 3/10 precordial chest pain this morning which resolved after sublingual NTG. EKG without acute changes. Has intermittent atypical chest pain, this am mild chest discomfort appeared MSS when having mild dry cough. Resolved.  Continue aspirin, Plavix, statins, metoprolol and Imdur.  Tobacco abuse  Cessation counseled. States was smoking 3 cigarettes/ day.  Essential hypertension  Was uncontrolled on Toprol-XL, losartan and Imdur.  Cardiology added clonidine 0.1 mg twice daily. Better but still not optimal especially premedications  in the morning. Some of this may be driven by patient anxiety. Asymptomatic of same. Continue current regimen at DC and close OP follow up re further adjustment of anti hypertensive's.  Could consider amlodipine instead of Clonidine if doesn't work.  Hyperlipidemia  Continue atorvastatin 40 mg daily.  Hypokalemia   Replaced. Magnesium normal.  Type II DM with hyperglycemia  uncontrolled and fluctuating.  Treated in the hospital with Lantus 10 units daily and SSI. Oral meds held and resumed at DC.  Last A1c 8.7. Poor OP control. As per my extensive d/w patient's mother yesterday, patient is non compliant with his meds and diet. Counciled patient and Mom re compliance.  Anxiety disorder  Reports taking Klonopin at home.  However as per prior hospitalist review of PDMP, has not filled since 2019.  Here on Klonopin and hydroxyzine as needed for anxiety. Will not prescribe on discharge and patient advised that he is to follow-up with his PCP regarding this. He verbalized understanding.  Recent empyema  No acute issues at this time.  Chest x-ray shows persistent small right pleural effusion with scarring.  Lung nodules  Follow with pulmonologist as outpatient.  Nutritional Status Nutrition Problem: Moderate Malnutrition Etiology: chronic illness(CHF) Signs/Symptoms: mild fat depletion, moderate fat depletion, mild muscle depletion, moderate muscle depletion Interventions: Ensure Enlive (each supplement provides 350kcal and 20 grams of protein), MVI, Magic cup  Social issues Patient concerned regarding his difficulty in paying bills. Requested RN to consult social work/financial counselor who reportedly met with patient.  ED/Hospital notes reviewed.    ROS:   Constitutional:  No f/c, No night sweats, No unexplained weight loss. EENT:  No vision changes, No blurry vision, No hearing changes. No mouth, throat, or ear problems.  Respiratory: No cough, + SOB Cardiac: + CP, no palpitations GI:  No abd pain, No N/V/D. GU: No Urinary s/sx Musculoskeletal: No joint pain Neuro: No headache, no dizziness, no motor weakness.  Skin: No rash Endocrine:  No polydipsia. No polyuria.  Psych: Denies SI/HI  No problems updated.  ALLERGIES: No Known Allergies  PAST MEDICAL HISTORY:  Past Medical History:  Diagnosis Date  . CHF (congestive heart failure), NYHA class I ([place]) 2005   patient  . Diabetes ([place]) 2018   Patient  . MI (myocardial infarction) ([place]) 2015   Patient  . Smoker 1987   Source    MEDICATIONS AT HOME: Prior to Admission medications   Medication Sig Start Date End Date Taking? Authorizing Provider  aspirin 81 MG chewable tablet Chew 1 tablet (81 mg total) by mouth daily. 03/05/19  Yes [name], MD  atorvastatin (LIPITOR) 40 MG tablet Take 1 tablet (40 mg total) by mouth daily at 6 PM. 03/05/19  Yes [name], MD  clopidogrel (PLAVIX) 75 MG tablet Take 1 tablet (75 mg total) by mouth daily. 03/05/19  Yes [name], MD  feeding supplement, ENSURE ENLIVE, (ENSURE ENLIVE) LIQD Take 237 mLs by mouth 2 (two) times daily between meals. 03/16/19  Yes Hongalgi, Lenis Dickinson, MD  furosemide (LASIX) 40 MG tablet Take 1 tablet (40 mg total) by mouth daily. Take an extra Lasix tablet (40 mg total) if weight starts to increase greater than 3 pounds. 03/16/19  Yes Hongalgi, Lenis Dickinson, MD  glipiZIDE (GLUCOTROL) 5 MG tablet Take 1 tablet (5 mg total) by mouth 2 (two) times daily. 03/05/19 03/04/20 Yes [name], MD  isosorbide mononitrate (IMDUR) 30 MG 24 hr tablet Take 1 tablet (30 mg total)  by mouth daily. 03/05/19  Yes [name], MD  losartan (COZAAR) 100 MG tablet Take 1 tablet (100 mg total) by mouth daily. 03/05/19  Yes [name], MD  metFORMIN (GLUCOPHAGE) 500 MG tablet Take 2 tablets (1,000 mg total) by mouth 2 (two) times daily with a meal. 03/22/19  Yes McClung, Angela M, PA-C  metoprolol succinate (TOPROL-XL) 100 MG 24 hr tablet Take 1 tablet (100 mg total) by mouth daily. Take with or immediately following a meal. 03/05/19  Yes [name], MD  nitroGLYCERIN (NITROSTAT) 0.4 MG SL tablet Place 1 tablet (0.4 mg total) under the tongue every 5 (five) minutes as needed for chest pain. 03/22/19  Yes Argentina Donovan,  PA-C  cloNIDine (CATAPRES) 0.1 MG tablet Take 1 tablet (0.1 mg total) by mouth 2 (two) times daily. 03/22/19   Argentina Donovan, PA-C  hydrOXYzine (ATARAX/VISTARIL) 25 MG tablet Take 1 tablet (25 mg total) by mouth 3 (three) times daily as needed. 03/22/19   Argentina Donovan, PA-C  Insulin Glargine (LANTUS SOLOSTAR) 100 UNIT/ML Solostar Pen Inject 10 Units into the skin daily. 03/22/19   Argentina Donovan, PA-C  Insulin Pen Needle (PEN NEEDLES) 31G X 5 MM MISC 1 each by Does not apply route at bedtime. 03/22/19   Argentina Donovan, PA-C     Objective:  EXAM:   Vitals:   03/22/19 1435 03/22/19 1448  BP: (!) 189/143 (!) 202/145  Pulse: 84   SpO2: 95%   Weight: 125 lb 9.6 oz (57 kg)   Height: 5' 6"  (1.676 m)     General appearance : A&OX3. NAD. Non-toxic-appearing; appears cachectic despite 21 pound weight increase since last weighed at the hospital 03/16/2019 at 104 pounds.  Poor historian. HEENT: Atraumatic and Normocephalic.  PERRLA. EOM intact.   Chest/Lungs:  Breathing-non-labored, Good air entry bilaterally, breath sounds normal without rales, rhonchi, or wheezing  CVS: S1 S2 regular, no murmurs, gallops, rubs  Extremities: Bilateral Lower Ext shows <1+ edema, both legs are warm to touch with = pulse throughout Neurology:  CN II-XII grossly intact, Non focal.   Psych:  TP somewhat linear. J/I poor. Normal speech. Appropriate eye contact and blunted affect.  Skin:  No Rash  Data Review Lab Results  Component Value Date   HGBA1C 8.7 (H) 02/04/2019     Assessment & Plan    I TRIED AT LENGTH TO GET THE PATIENT TO GO TO THE HOSPITAL TODAY BUT HE REFUSES.  He does agree he will go if he worsens at all.   1. Diabetes mellitus type 2 with complications, uncontrolled (HCC) Uncontrolled-increase dose metformin and add lantus.  Check blood sugars 2-3 times daily and record and bring to next visit.   - Glucose (CBG) - insulin aspart (novoLOG) injection 20 Units-blood sugar had not gone  down in office at discharge. - metFORMIN (GLUCOPHAGE) 500 MG tablet; Take 2 tablets (1,000 mg total) by mouth 2 (two) times daily with a meal.  Dispense: 120 tablet; Refill: 6 - Insulin Glargine (LANTUS SOLOSTAR) 100 UNIT/ML Solostar Pen; Inject 10 Units into the skin daily.  Dispense: 5 pen; Refill: PRN - Insulin Pen Needle (PEN NEEDLES) 31G X 5 MM MISC; 1 each by Does not apply route at bedtime.  Dispense: 100 each; Refill: 2 - Comprehensive metabolic panel - CBC with Differential/Platelet; Future  2. Essential hypertension Uncontrolled-must take clonidine and other regimen as planned on hospital d/c.   - cloNIDine (CATAPRES) 0.1 MG tablet; Take 1  tablet (0.1 mg total) by mouth 2 (two) times daily.  Dispense: 60 tablet; Refill: 2 - cloNIDine (CATAPRES) tablet 0.2 mg-BP went to 190/128-he will get meds picked up today - Comprehensive metabolic panel - CBC with Differential/Platelet; Future  3. Anxiety- patient previously on clonazepam but hasn't taken in about 1 month - hydrOXYzine (ATARAX/VISTARIL) 25 MG tablet; Take 1 tablet (25 mg total) by mouth 3 (three) times daily as needed.  Dispense: 30 tablet; Refill: 4 - Ambulatory referral to Psychiatry  4. Chronic systolic congestive heart failure (HCC) Weight gain-although no rales and minimal edema, concern for worsening CHF due to weight gain Increase lasix to 1m bid x3 days then 46mbid X 3 days then 4023maily, call 911 if SOB worsens or CP worsens.  Has cardiology f/up 04/03/2019.    5. Hospital discharge follow-up Patient in poor health overall.    Patient have been counseled extensively about nutrition and exercise  Return in about 3 weeks (around 04/12/2019) for Dr JohWynetta Emeryr chronic medical conditions.  The patient was given clear instructions to go to ER or return to medical center if symptoms don't improve, worsen or new problems develop. The patient verbalized understanding. The patient was told to call to get lab results if  they haven't heard anything in the next week.     AngFreeman CaldronA-C ConWake Forest Out[facility] Centerd WelBartonsvilleeParkC Dobbins9/17/2020, 3:12 PM

## 2019-03-21 NOTE — Telephone Encounter (Signed)
Please advise to increase Glipizide to 10mg   Bid until appt tomorrow.

## 2019-03-22 ENCOUNTER — Other Ambulatory Visit: Payer: Self-pay

## 2019-03-22 ENCOUNTER — Ambulatory Visit: Payer: Self-pay | Attending: Family Medicine | Admitting: Physician Assistant

## 2019-03-22 VITALS — BP 190/128 | HR 80 | Ht 66.0 in | Wt 125.6 lb

## 2019-03-22 DIAGNOSIS — IMO0002 Reserved for concepts with insufficient information to code with codable children: Secondary | ICD-10-CM

## 2019-03-22 DIAGNOSIS — I10 Essential (primary) hypertension: Secondary | ICD-10-CM

## 2019-03-22 DIAGNOSIS — E118 Type 2 diabetes mellitus with unspecified complications: Secondary | ICD-10-CM

## 2019-03-22 DIAGNOSIS — F419 Anxiety disorder, unspecified: Secondary | ICD-10-CM

## 2019-03-22 DIAGNOSIS — E1165 Type 2 diabetes mellitus with hyperglycemia: Secondary | ICD-10-CM

## 2019-03-22 DIAGNOSIS — I5022 Chronic systolic (congestive) heart failure: Secondary | ICD-10-CM

## 2019-03-22 DIAGNOSIS — Z09 Encounter for follow-up examination after completed treatment for conditions other than malignant neoplasm: Secondary | ICD-10-CM

## 2019-03-22 LAB — GLUCOSE, POCT (MANUAL RESULT ENTRY)
POC Glucose: 317 mg/dl — AB (ref 70–99)
POC Glucose: 323 mg/dl — AB (ref 70–99)

## 2019-03-22 MED ORDER — METFORMIN HCL 500 MG PO TABS: 1000.0000 mg | ORAL_TABLET | Freq: Two times a day (BID) | ORAL | 6 refills | Status: AC

## 2019-03-22 MED ORDER — CLONIDINE HCL 0.2 MG PO TABS: 0.2000 mg | ORAL_TABLET | Freq: Once | ORAL | Status: AC

## 2019-03-22 MED ORDER — INSULIN ASPART 100 UNIT/ML ~~LOC~~ SOLN: 20.0000 [IU] | Freq: Once | SUBCUTANEOUS | Status: AC

## 2019-03-22 MED ORDER — LANTUS SOLOSTAR 100 UNIT/ML ~~LOC~~ SOPN: 10.0000 [IU] | PEN_INJECTOR | Freq: Every day | SUBCUTANEOUS | 99 refills | Status: AC

## 2019-03-22 MED ORDER — PEN NEEDLES 31G X 5 MM MISC: 1.0000 | Freq: Every day | 2 refills | Status: AC

## 2019-03-22 MED ORDER — HYDROXYZINE HCL 25 MG PO TABS: 25.0000 mg | ORAL_TABLET | Freq: Three times a day (TID) | ORAL | 4 refills | Status: AC | PRN

## 2019-03-22 MED ORDER — NITROGLYCERIN 0.4 MG SL SUBL: 0.4000 mg | SUBLINGUAL_TABLET | SUBLINGUAL | 2 refills | Status: AC | PRN

## 2019-03-22 MED ORDER — CLONIDINE HCL 0.1 MG PO TABS: 0.1000 mg | ORAL_TABLET | Freq: Two times a day (BID) | ORAL | 2 refills | Status: AC

## 2019-03-22 MED ORDER — INSULIN ASPART 100 UNIT/ML ~~LOC~~ SOLN: 25.0000 [IU] | Freq: Once | SUBCUTANEOUS | Status: DC

## 2019-03-22 MED FILL — NITROGLYCERIN 0.4 MG TAB SL: 0.4 | 20 days supply | Qty: 25 | Fill #0

## 2019-03-22 MED FILL — !LANTUS SOLOSTAR 100UNITS/M: 100 | 30 days supply | Qty: 3 | Fill #0

## 2019-03-22 MED FILL — metFORMIN HCL 500 MG TABS: 500 | 30 days supply | Qty: 120 | Fill #0

## 2019-03-22 MED FILL — hydrOXYzine HCL 25 MG TABS: 25 | 10 days supply | Qty: 30 | Fill #0

## 2019-03-22 NOTE — Patient Instructions (Addendum)
[place] IF YOU WORSEN AT ALL!!!   Take 2 lasix(80mg ) twice daily for 3 days. Then 1(40mg ) twice daily for 3 days then 1 daily.  Call 911 for chest pain or worsening shortness of breath.    Start insulin 10 units at night time in addition to increasing the dose of the metformin.  Check your sugars 2-3 times daily and record.

## 2019-03-23 LAB — COMPREHENSIVE METABOLIC PANEL
ALT: 42 IU/L (ref 0–44)
AST: 30 IU/L (ref 0–40)
Albumin/Globulin Ratio: 1.3 (ref 1.2–2.2)
Albumin: 3.6 g/dL — ABNORMAL LOW (ref 3.8–4.9)
Alkaline Phosphatase: 123 IU/L — ABNORMAL HIGH (ref 39–117)
BUN/Creatinine Ratio: 33 — ABNORMAL HIGH (ref 9–20)
BUN: 27 mg/dL — ABNORMAL HIGH (ref 6–24)
Bilirubin Total: 0.4 mg/dL (ref 0.0–1.2)
CO2: 26 mmol/L (ref 20–29)
Calcium: 9.3 mg/dL (ref 8.7–10.2)
Chloride: 102 mmol/L (ref 96–106)
Creatinine, Ser: 0.83 mg/dL (ref 0.76–1.27)
GFR calc Af Amer: 116 mL/min/{1.73_m2} (ref >59)
GFR calc non Af Amer: 100 mL/min/{1.73_m2} (ref >59)
Globulin, Total: 2.7 g/dL (ref 1.5–4.5)
Glucose: 298 mg/dL — ABNORMAL HIGH (ref 65–99)
Potassium: 4 mmol/L (ref 3.5–5.2)
Sodium: 142 mmol/L (ref 134–144)
Total Protein: 6.3 g/dL (ref 6.0–8.5)

## 2019-03-23 LAB — CBC WITH DIFFERENTIAL/PLATELET
Basophils Absolute: 0.1 10*3/uL (ref 0.0–0.2)
Basos: 1 %
EOS (ABSOLUTE): 0.2 10*3/uL (ref 0.0–0.4)
Eos: 2 %
Hematocrit: 39 % (ref 37.5–51.0)
Hemoglobin: 12.8 g/dL — ABNORMAL LOW (ref 13.0–17.7)
Immature Grans (Abs): 0 10*3/uL (ref 0.0–0.1)
Immature Granulocytes: 0 %
Lymphocytes Absolute: 1.7 10*3/uL (ref 0.7–3.1)
Lymphs: 21 %
MCH: 32.1 pg (ref 26.6–33.0)
MCHC: 32.8 g/dL (ref 31.5–35.7)
MCV: 98 fL — ABNORMAL HIGH (ref 79–97)
Monocytes Absolute: 0.5 10*3/uL (ref 0.1–0.9)
Monocytes: 6 %
Neutrophils Absolute: 5.7 10*3/uL (ref 1.4–7.0)
Neutrophils: 70 %
Platelets: 176 10*3/uL (ref 150–450)
RBC: 3.99 x10E6/uL — ABNORMAL LOW (ref 4.14–5.80)
RDW: 15.3 % (ref 11.6–15.4)
WBC: 8.1 10*3/uL (ref 3.4–10.8)

## 2019-03-28 ENCOUNTER — Telehealth: Payer: Self-pay | Admitting: Internal Medicine

## 2019-03-28 NOTE — Telephone Encounter (Signed)
New Message  Darlina Guys from, [facility] is calling to let Dr. Wynetta Emery know a referral was sent for at home services and they can have the first visit on 04/14/2019. Please f/u

## 2019-03-30 ENCOUNTER — Telehealth: Payer: Self-pay | Admitting: Interventional Cardiology

## 2019-03-30 NOTE — Telephone Encounter (Signed)
Pts mother called to report that the pt has been declining since his D/C from the hosp 03/16/19 but since yesterday has significantly become much worse.   He has been falling, incontinent, constipated. He has had a "mean " demeanor.. he is in bed moaning that he hurts all over and his testicles are very painful.   She reports that he will not eat or drink and is acting like he does not know where he is and his speech is harder to understand than usual.   She says she had a CVA at the age of 85.. I advised her that his symptoms are very worrisome and he sounds very sick ... I strongly urged her to call EMS ASAP.  She verbalized understanding and agreed.

## 2019-03-30 NOTE — Telephone Encounter (Signed)
New Message  Pt c/o Shortness Of Breath: STAT if SOB developed within the last 24 hours or pt is noticeably SOB on the phone  1. Are you currently SOB (can you hear that pt is SOB on the phone)? Yes   2. How long have you been experiencing SOB? Since he left the hospital  3. Are you SOB when sitting or when up moving around? Sitting and moving around.  4. Are you currently experiencing any other symptoms? States that patient is having memory loss issues, loss of balance and keeps falling, stomach pain, bladder issues (not able to hold his urine),  having delusions, patient not eating, in a lot pain, testicular pain, SOB, weakness, and fatigue.

## 2019-04-03 ENCOUNTER — Encounter: Payer: Self-pay | Admitting: *Deleted

## 2019-04-03 ENCOUNTER — Ambulatory Visit: Payer: Self-pay | Admitting: Physician Assistant

## 2019-04-05 DEATH — deceased

## 2019-05-08 ENCOUNTER — Ambulatory Visit: Payer: Self-pay | Admitting: Internal Medicine

## 2019-05-14 ENCOUNTER — Ambulatory Visit: Payer: Self-pay | Admitting: Interventional Cardiology

## 2021-05-24 IMAGING — DX PORTABLE CHEST - 1 VIEW
1 series · 1 of 1 positions shown · non-contrast
Comparison: 02/05/2019 chest radiographs, and earlier including
[facility] chest CT 02/04/2019.

CLINICAL DATA: 53-year-old male status post chest tube placement
yesterday for empyema.

EXAM:
PORTABLE CHEST 1 VIEW

[chest ap]
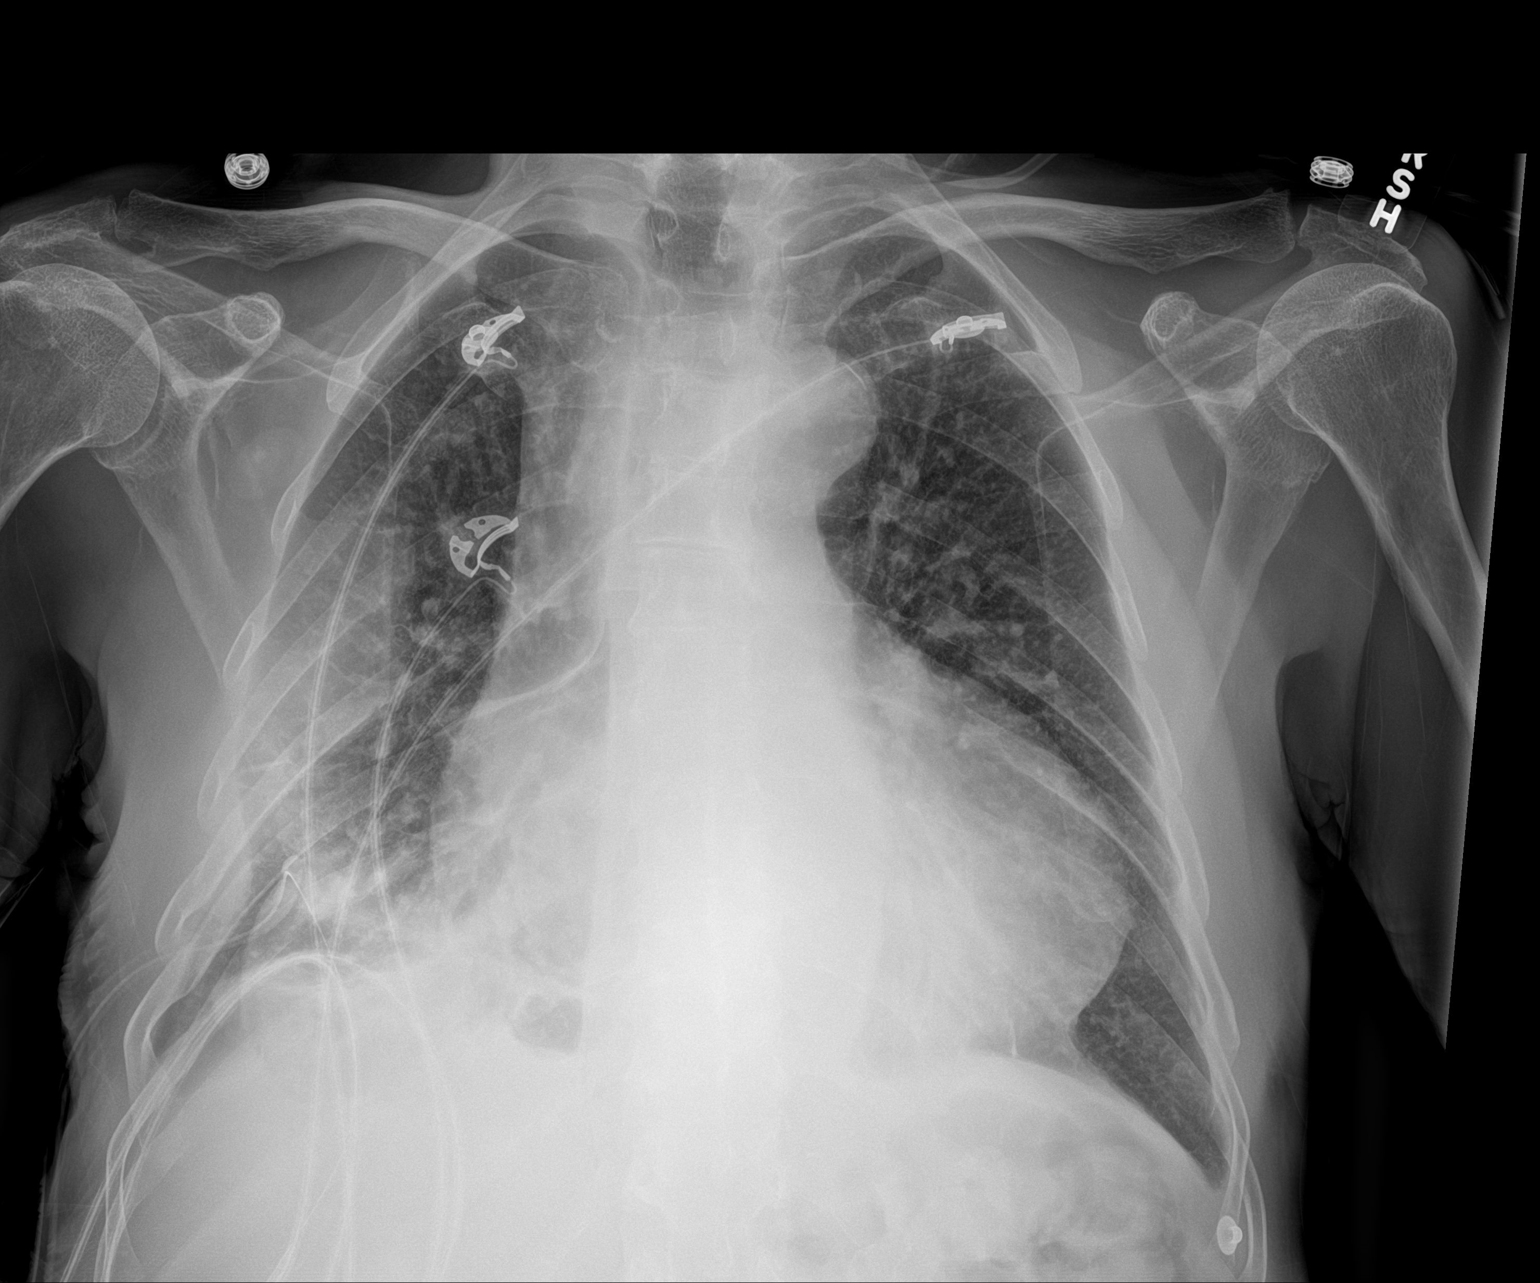

[1 of 1 positions shown; findings below may reference images not displayed]

FINDINGS: Portable AP upright view at 4585 hours. Stable right chest tube
located at the right lung base. No pneumothorax. Veiling opacity in
the right mid and lower lung compatible with residual fluid, which
might be within the fissure. Additional patchy atelectasis or
scarring at the right lung base.

Underlying cardiomegaly. Stable cardiac size and mediastinal
contours. Stable left lung. Visualized tracheal air column is within
normal limits. Negative visible bowel gas pattern. No acute osseous
abnormality identified.
IMPRESSION: 1. Stable right chest tube. No pneumothorax.
2. Residual right pleural effusion, some may be loculated and/or
within the fissure.
3. Cardiomegaly.

## 2021-06-06 IMAGING — CR CHEST - 2 VIEW
2 series · 2 of 2 positions shown · non-contrast
Comparison: 02/06/2019

CLINICAL DATA: Chest pain

EXAM:
CHEST - 2 VIEW

[chest pa]
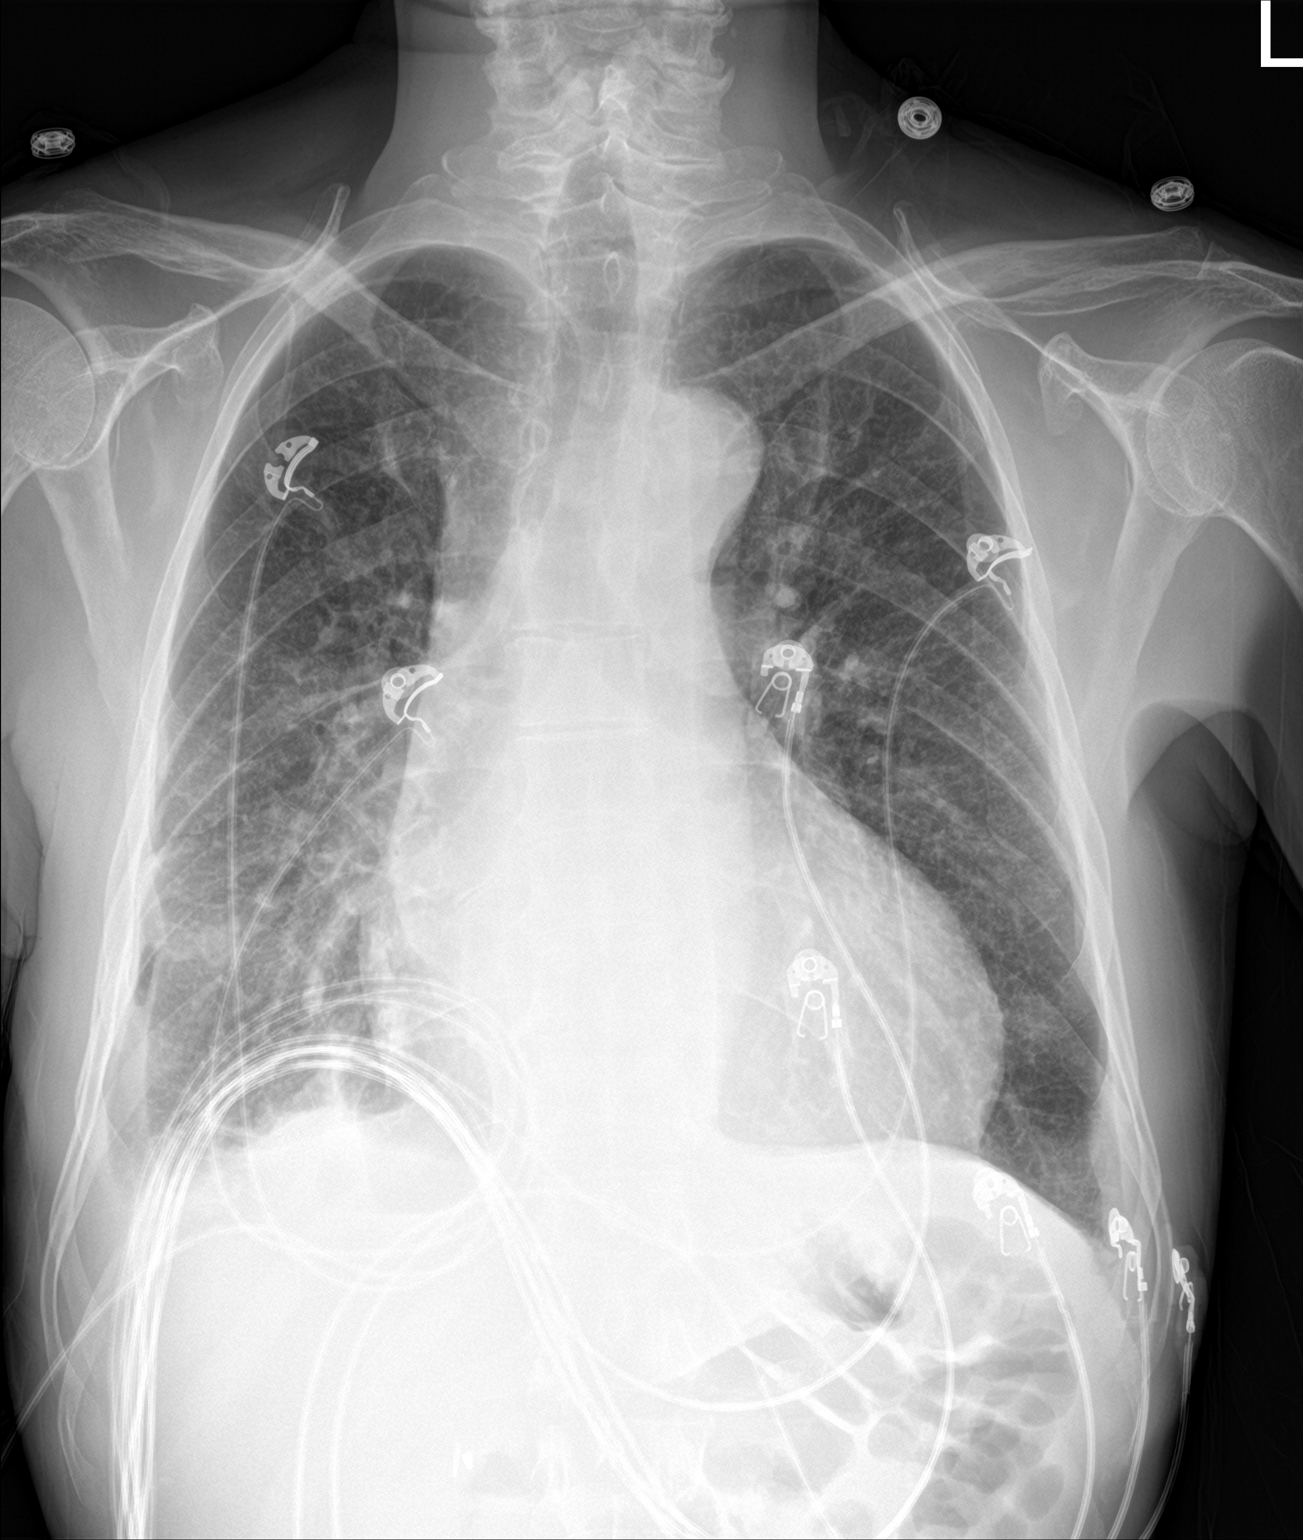

[chest lat]
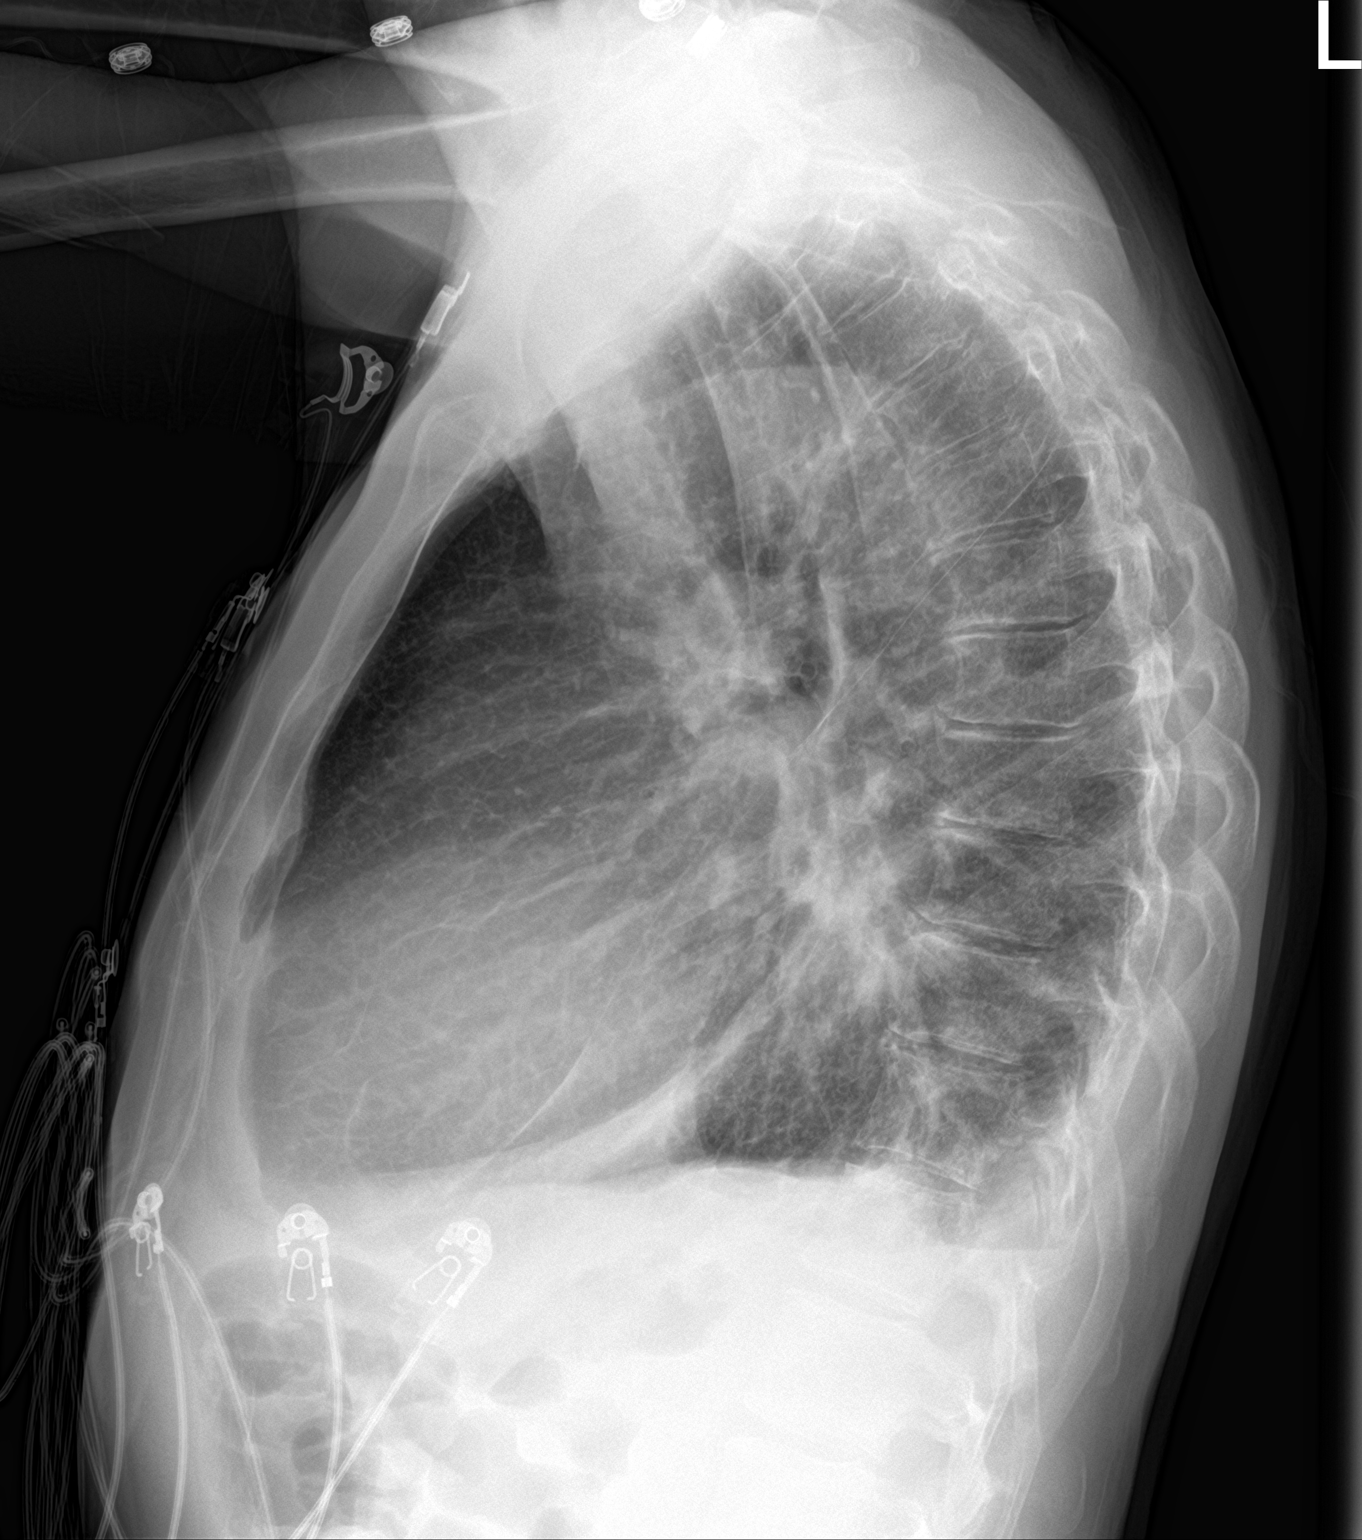

[2 of 2 positions shown; findings below may reference images not displayed]

FINDINGS: Stable cardiomegaly. Aortic calcified and tortuous. Interval removal
of a right-sided chest tube. A small loculated air and fluid
collection at the lateral right lung base persists, compatible with
a small residual hydropneumothorax. This has decreased in size
compared to the prior study. Small loculated left pleural effusion.
The left lung is otherwise clear.
IMPRESSION: 1. Interval removal of right-sided chest tube. Small loculated
hydropneumothorax persists at the lateral aspect of the right lung
base, decreased in size from prior.
2. Small loculated left pleural effusion.
